# Patient Record
Sex: Male | Born: 1950 | Race: White | Hispanic: No | Marital: Married | Smoking: Never smoker
Health system: Southern US, Community
[De-identification: ages and names within clinical notes are randomized; demographics above are authoritative.]

## PROBLEM LIST (undated history)

## (undated) DIAGNOSIS — N029 Recurrent and persistent hematuria with unspecified morphologic changes: Secondary | ICD-10-CM

## (undated) DIAGNOSIS — N401 Enlarged prostate with lower urinary tract symptoms: Secondary | ICD-10-CM

## (undated) DIAGNOSIS — C801 Malignant (primary) neoplasm, unspecified: Secondary | ICD-10-CM

## (undated) DIAGNOSIS — G4730 Sleep apnea, unspecified: Secondary | ICD-10-CM

## (undated) DIAGNOSIS — E785 Hyperlipidemia, unspecified: Secondary | ICD-10-CM

## (undated) DIAGNOSIS — I10 Essential (primary) hypertension: Secondary | ICD-10-CM

## (undated) DIAGNOSIS — M109 Gout, unspecified: Secondary | ICD-10-CM

## (undated) DIAGNOSIS — T7840XA Allergy, unspecified, initial encounter: Secondary | ICD-10-CM

## (undated) DIAGNOSIS — N138 Other obstructive and reflux uropathy: Secondary | ICD-10-CM

## (undated) HISTORY — DX: Recurrent and persistent hematuria with unspecified morphologic changes: N02.9

## (undated) HISTORY — DX: Allergy, unspecified, initial encounter: T78.40XA

## (undated) HISTORY — DX: Other obstructive and reflux uropathy: N13.8

## (undated) HISTORY — DX: Benign prostatic hyperplasia with lower urinary tract symptoms: N40.1

## (undated) HISTORY — PX: STRABISMUS SURGERY: SHX218

## (undated) HISTORY — DX: Essential (primary) hypertension: I10

## (undated) HISTORY — DX: Gout, unspecified: M10.9

## (undated) HISTORY — DX: Malignant (primary) neoplasm, unspecified: C80.1

## (undated) HISTORY — DX: Hyperlipidemia, unspecified: E78.5

## (undated) HISTORY — PX: FRACTURE SURGERY: SHX138

---

## 1996-09-21 ENCOUNTER — Encounter: Payer: Self-pay | Admitting: Pulmonary Disease

## 1997-06-09 ENCOUNTER — Encounter: Payer: Self-pay | Admitting: Pulmonary Disease

## 1997-06-09 ENCOUNTER — Ambulatory Visit: Admission: RE | Admit: 1997-06-09 | Discharge: 1997-06-09 | Payer: Self-pay | Admitting: Pulmonary Disease

## 1999-06-27 ENCOUNTER — Ambulatory Visit (HOSPITAL_BASED_OUTPATIENT_CLINIC_OR_DEPARTMENT_OTHER): Admission: RE | Admit: 1999-06-27 | Discharge: 1999-06-27 | Payer: Self-pay | Admitting: Pulmonary Disease

## 1999-06-27 ENCOUNTER — Encounter: Payer: Self-pay | Admitting: Pulmonary Disease

## 2005-01-27 ENCOUNTER — Ambulatory Visit: Payer: Self-pay | Admitting: Urology

## 2005-08-25 ENCOUNTER — Ambulatory Visit: Payer: Self-pay | Admitting: Urology

## 2008-02-02 ENCOUNTER — Ambulatory Visit: Payer: Self-pay | Admitting: Pulmonary Disease

## 2008-02-02 DIAGNOSIS — I10 Essential (primary) hypertension: Secondary | ICD-10-CM

## 2008-02-02 DIAGNOSIS — G4733 Obstructive sleep apnea (adult) (pediatric): Secondary | ICD-10-CM

## 2008-03-31 ENCOUNTER — Encounter: Payer: Self-pay | Admitting: Pulmonary Disease

## 2008-06-12 ENCOUNTER — Encounter: Payer: Self-pay | Admitting: Pulmonary Disease

## 2008-07-11 ENCOUNTER — Telehealth (INDEPENDENT_AMBULATORY_CARE_PROVIDER_SITE_OTHER): Payer: Self-pay | Admitting: *Deleted

## 2010-04-22 ENCOUNTER — Encounter: Payer: Self-pay | Admitting: Pulmonary Disease

## 2010-04-24 ENCOUNTER — Ambulatory Visit (INDEPENDENT_AMBULATORY_CARE_PROVIDER_SITE_OTHER): Payer: BC Managed Care – PPO | Admitting: Pulmonary Disease

## 2010-04-24 ENCOUNTER — Encounter: Payer: Self-pay | Admitting: Pulmonary Disease

## 2010-04-24 VITALS — BP 126/78 | HR 57 | Temp 97.9°F | Ht 68.0 in | Wt 223.6 lb

## 2010-04-24 DIAGNOSIS — G4733 Obstructive sleep apnea (adult) (pediatric): Secondary | ICD-10-CM

## 2010-04-24 NOTE — Assessment & Plan Note (Signed)
The pt is doing well with cpap, and feels he is sleeping well with adequate daytime alertness.  His only complaint is mouth dryness, and admits that he is mouth opening during the night.  Will change him over to full face mask, and see if things improve.  I have also encouraged him to work on weight loss.

## 2010-04-24 NOTE — Patient Instructions (Signed)
Will send an order to get you a new full face mask Can increase temperature on your humidifier if you continue having dryness after changing to full face mask Work on weight loss followup with me in 12mos

## 2010-04-24 NOTE — Progress Notes (Signed)
  Subjective:    Patient ID: Eugene Scott, male    DOB: [birth year], 60 y.o.   MRN: [id number]  HPI The pt comes in today for f/u of his osa.  He has been wearing cpap compliantly, and feels it is still helping his sleep and daytime alertness.  His only complaint is that of mouth dryness.  He is wearing a nasal mask, and his wife feels that he has been having mouth opening.  He has not made adjustments to his humidifier.    Review of Systems  Constitutional: Negative for fever and unexpected weight change.  HENT: Negative for ear pain, nosebleeds, congestion, sore throat, rhinorrhea, sneezing, trouble swallowing, dental problem, postnasal drip and sinus pressure.   Eyes: Negative for redness and itching.  Respiratory: Negative for cough, chest tightness, shortness of breath and wheezing.   Cardiovascular: Negative for palpitations and leg swelling.  Gastrointestinal: Negative for nausea and vomiting.  Genitourinary: Negative for dysuria.  Musculoskeletal: Negative for joint swelling.  Skin: Negative for rash.  Neurological: Negative for headaches.  Hematological: Does not bruise/bleed easily.  Psychiatric/Behavioral: Negative for dysphoric mood. The patient is not nervous/anxious.        Objective:   Physical Exam Ow male in nad No skin breakdown or pressure necrosis from cpap mask LE without edema, no cyanosis  Alert, does not appear sleepy, moves all 4        Assessment & Plan:

## 2011-11-11 ENCOUNTER — Encounter: Payer: Self-pay | Admitting: Pulmonary Disease

## 2011-11-11 ENCOUNTER — Ambulatory Visit (INDEPENDENT_AMBULATORY_CARE_PROVIDER_SITE_OTHER): Payer: BC Managed Care – PPO | Admitting: Pulmonary Disease

## 2011-11-11 VITALS — BP 110/82 | HR 67 | Temp 98.2°F | Ht 69.0 in | Wt 224.0 lb

## 2011-11-11 DIAGNOSIS — G4733 Obstructive sleep apnea (adult) (pediatric): Secondary | ICD-10-CM

## 2011-11-11 NOTE — Progress Notes (Signed)
  Subjective:    Patient ID: Eugene Scott, male    DOB: [birth year], 61 y.o.   MRN: [id number]  HPI The patient comes in today for followup of his obstructive sleep apnea.  He is wearing CPAP compliantly, and feels that he is doing well with the device.  He is satisfied with his sleep at night and also his daytime alertness.  He is due for a new mask and supplies.   Review of Systems  Constitutional: Negative for fever and unexpected weight change.  HENT: Negative for ear pain, nosebleeds, congestion, sore throat, rhinorrhea, sneezing, trouble swallowing, dental problem, postnasal drip and sinus pressure.   Eyes: Negative for redness and itching.  Respiratory: Negative for cough, chest tightness, shortness of breath and wheezing.   Cardiovascular: Negative for palpitations and leg swelling.  Gastrointestinal: Negative for nausea and vomiting.  Genitourinary: Negative for dysuria.  Musculoskeletal: Negative for joint swelling.  Skin: Negative for rash.  Neurological: Negative for headaches.  Hematological: Does not bruise/bleed easily.  Psychiatric/Behavioral: Negative for dysphoric mood. The patient is not nervous/anxious.        Objective:   Physical Exam Overweight male in no acute distress Nose without purulence or discharge noted No skin breakdown or pressure necrosis from the CPAP mask Lower extremities without edema, no cyanosis Alert and oriented, moves all 4 extremities, does not appear to be sleepy.       Assessment & Plan:

## 2011-11-11 NOTE — Patient Instructions (Addendum)
Stay on cpap, and keep up with mask changes and supplies Will send an order for a new mask Work on weight loss  followup with me in one year.

## 2011-11-11 NOTE — Assessment & Plan Note (Signed)
The patient is doing very well with CPAP, and feels that his sleep is adequate as well as his daytime alertness.  I have asked him to continue wearing CPAP, and to keep up with mask changes and supplies.  I have also encouraged him to work aggressively on weight loss.

## 2012-02-22 ENCOUNTER — Telehealth: Payer: Self-pay | Admitting: Pulmonary Disease

## 2012-02-22 NOTE — Telephone Encounter (Signed)
Alida have you seen a CMN for this patient?Carron Curie, CMA

## 2012-02-23 NOTE — Telephone Encounter (Signed)
cmn has been faxed.  Spoke with [facility], they have received .Kandice Hams

## 2012-11-11 ENCOUNTER — Ambulatory Visit: Payer: BC Managed Care – PPO | Admitting: Pulmonary Disease

## 2012-12-13 ENCOUNTER — Ambulatory Visit: Payer: BC Managed Care – PPO | Admitting: Pulmonary Disease

## 2013-01-13 ENCOUNTER — Ambulatory Visit: Payer: BC Managed Care – PPO | Admitting: Pulmonary Disease

## 2013-02-08 ENCOUNTER — Encounter: Payer: Self-pay | Admitting: Pulmonary Disease

## 2013-02-08 ENCOUNTER — Encounter (INDEPENDENT_AMBULATORY_CARE_PROVIDER_SITE_OTHER): Payer: Self-pay

## 2013-02-08 ENCOUNTER — Ambulatory Visit (INDEPENDENT_AMBULATORY_CARE_PROVIDER_SITE_OTHER): Payer: BC Managed Care – PPO | Admitting: Pulmonary Disease

## 2013-02-08 VITALS — BP 122/86 | HR 62 | Temp 97.9°F | Ht 68.0 in | Wt 230.0 lb

## 2013-02-08 DIAGNOSIS — G4733 Obstructive sleep apnea (adult) (pediatric): Secondary | ICD-10-CM

## 2013-02-08 NOTE — Progress Notes (Signed)
   Subjective:    Patient ID: Eugene Scott, male    DOB: 1950/02/06, 63 y.o.   MRN: [id number]  HPI The patient comes in today for followup of his obstructive sleep apnea. He is wearing CPAP compliantly, and is having no issues with his pressure or mask fit. He is having some dryness issues, and I've asked him to turn the heat up on his humidifier.  He is satisfied with his sleep at this point, as well as his daytime alertness. Of note, his weight is up 6 pounds since last visit.   Review of Systems  Constitutional: Negative for fever and unexpected weight change.  HENT: Negative for congestion, dental problem, ear pain, nosebleeds, postnasal drip, rhinorrhea, sinus pressure, sneezing, sore throat and trouble swallowing.   Eyes: Negative for redness and itching.  Respiratory: Negative for cough, chest tightness, shortness of breath and wheezing.   Cardiovascular: Negative for palpitations and leg swelling.  Gastrointestinal: Negative for nausea and vomiting.  Genitourinary: Negative for dysuria.  Musculoskeletal: Negative for joint swelling.  Skin: Negative for rash.  Neurological: Negative for headaches.  Hematological: Does not bruise/bleed easily.  Psychiatric/Behavioral: Negative for dysphoric mood. The patient is not nervous/anxious.        Objective:   Physical Exam Overweight male in no acute distress Nose without purulence or discharge noted No skin breakdown or pressure necrosis from the CPAP mask Neck without lymphadenopathy or thyromegaly Lower extremities with minimal edema, no cyanosis Alert and oriented, moves all 4 extremities.       Assessment & Plan:

## 2013-02-08 NOTE — Patient Instructions (Signed)
Continue on cpap, and keep up with supplies and mask changes. Turn heat up on humidifier for more moisture. Work on weight loss followup with me in one year if doing well.

## 2013-02-08 NOTE — Assessment & Plan Note (Signed)
The patient is wearing CPAP compliantly, and feels that he is sleeping well with adequate daytime alertness. I've encouraged him to work aggressively on weight loss, and the patient states that he is getting ready to retire and focus more on his health.  I've also asked him to keep up with his supplies and mask cushion changes. Will see him back in one year if he is doing well.

## 2013-10-18 ENCOUNTER — Telehealth: Payer: Self-pay | Admitting: Pulmonary Disease

## 2013-10-18 DIAGNOSIS — G4733 Obstructive sleep apnea (adult) (pediatric): Secondary | ICD-10-CM

## 2013-10-18 NOTE — Telephone Encounter (Signed)
I called spoke with pt. He reports [facility] is going out of business. He wants to go with [facility]. Order placed. All records will need to be sent with order.  Order placed. Nothing further needed

## 2013-12-05 ENCOUNTER — Ambulatory Visit: Payer: Self-pay | Admitting: Urology

## 2014-02-09 ENCOUNTER — Ambulatory Visit: Payer: Self-pay | Admitting: Gastroenterology

## 2014-02-09 ENCOUNTER — Ambulatory Visit: Payer: BC Managed Care – PPO | Admitting: Pulmonary Disease

## 2014-02-26 ENCOUNTER — Ambulatory Visit (INDEPENDENT_AMBULATORY_CARE_PROVIDER_SITE_OTHER): Payer: BC Managed Care – PPO | Admitting: Pulmonary Disease

## 2014-02-26 ENCOUNTER — Encounter: Payer: Self-pay | Admitting: Pulmonary Disease

## 2014-02-26 ENCOUNTER — Encounter (INDEPENDENT_AMBULATORY_CARE_PROVIDER_SITE_OTHER): Payer: Self-pay

## 2014-02-26 VITALS — BP 118/66 | HR 71 | Temp 97.0°F | Ht 68.0 in | Wt 223.0 lb

## 2014-02-26 DIAGNOSIS — G4733 Obstructive sleep apnea (adult) (pediatric): Secondary | ICD-10-CM

## 2014-02-26 NOTE — Progress Notes (Signed)
   Subjective:    Patient ID: Eugene Scott, male    DOB: 11/10/1950, 64 y.o.   MRN: [id number]  HPI The patient comes in today for follow-up of his obstructive sleep apnea. C Pap compliantly, but is overdue for a new C Pap device. He feels that he has done well overall, and has lost 7 pounds since the last visit. He is also due for a new mask and supplies.   Review of Systems  Constitutional: Negative for fever and unexpected weight change.  HENT: Negative for congestion, dental problem, ear pain, nosebleeds, postnasal drip, rhinorrhea, sinus pressure, sneezing, sore throat and trouble swallowing.   Eyes: Negative for redness and itching.  Respiratory: Negative for cough, chest tightness, shortness of breath and wheezing.   Cardiovascular: Negative for palpitations and leg swelling.  Gastrointestinal: Negative for nausea and vomiting.  Genitourinary: Negative for dysuria.  Musculoskeletal: Negative for joint swelling.  Skin: Negative for rash.  Neurological: Negative for headaches.  Hematological: Does not bruise/bleed easily.  Psychiatric/Behavioral: Negative for dysphoric mood. The patient is not nervous/anxious.        Objective:   Physical Exam Overweight male in no acute distress Nose without purulence or discharge noted No skin breakdown or pressure necrosis from the C Pap mask Neck without lymphadenopathy or thyromegaly Lower extremities without significant edema, no cyanosis Alert and oriented, moves all 4 extremities.       Assessment & Plan:

## 2014-02-26 NOTE — Patient Instructions (Signed)
Will get you a new cpap machine, and keep on the auto setting. Keep working on weight loss followup with me again in one year

## 2014-02-26 NOTE — Assessment & Plan Note (Signed)
The patient has been doing. Well with C Pap, but is currently using a very old device that needs to be replaced. I will send an order to his home care company for this, as well as new supplies. I've encouraged him to continue working on aggressive weight loss, and will see him back in one year.

## 2014-08-28 ENCOUNTER — Ambulatory Visit: Payer: Self-pay | Admitting: Family Medicine

## 2014-09-03 ENCOUNTER — Ambulatory Visit: Payer: Self-pay | Admitting: Family Medicine

## 2014-10-18 ENCOUNTER — Telehealth: Payer: Self-pay

## 2014-10-18 MED ORDER — FINASTERIDE 5 MG PO TABS: 5.0000 mg | ORAL_TABLET | Freq: Every day | ORAL | Status: DC

## 2014-10-18 NOTE — Telephone Encounter (Signed)
[facility] requesting refill for  Finasteride 5mg  tab

## 2014-12-04 ENCOUNTER — Encounter: Payer: Self-pay | Admitting: Family Medicine

## 2014-12-04 ENCOUNTER — Ambulatory Visit (INDEPENDENT_AMBULATORY_CARE_PROVIDER_SITE_OTHER): Payer: BC Managed Care – PPO | Admitting: Family Medicine

## 2014-12-04 VITALS — BP 113/75 | HR 66 | Temp 97.7°F | Ht 67.0 in | Wt 222.0 lb

## 2014-12-04 DIAGNOSIS — Z113 Encounter for screening for infections with a predominantly sexual mode of transmission: Secondary | ICD-10-CM | POA: Diagnosis not present

## 2014-12-04 DIAGNOSIS — Z0000 Encounter for general adult medical examination without abnormal findings: Secondary | ICD-10-CM | POA: Diagnosis not present

## 2014-12-04 LAB — MICROSCOPIC EXAMINATION
Epithelial Cells (non renal): NONE SEEN /hpf (ref 0–10)
WBC, UA: NONE SEEN /hpf (ref 0–?)

## 2014-12-04 LAB — URINALYSIS, ROUTINE W REFLEX MICROSCOPIC
Bilirubin, UA: NEGATIVE
Glucose, UA: NEGATIVE
KETONES UA: NEGATIVE
Leukocytes, UA: NEGATIVE
NITRITE UA: NEGATIVE
Protein, UA: NEGATIVE
Specific Gravity, UA: 1.01 (ref 1.005–1.030)
Urobilinogen, Ur: 0.2 mg/dL (ref 0.2–1.0)
pH, UA: 7 (ref 5.0–7.5)

## 2014-12-04 MED ORDER — SIMVASTATIN 20 MG PO TABS: 20.0000 mg | ORAL_TABLET | Freq: Every day | ORAL | Status: DC

## 2014-12-04 MED ORDER — FINASTERIDE 5 MG PO TABS: 5.0000 mg | ORAL_TABLET | Freq: Every day | ORAL | Status: DC

## 2014-12-04 MED ORDER — ALLOPURINOL 300 MG PO TABS: 300.0000 mg | ORAL_TABLET | Freq: Every day | ORAL | Status: DC

## 2014-12-04 MED ORDER — BENAZEPRIL HCL 5 MG PO TABS: 5.0000 mg | ORAL_TABLET | Freq: Every day | ORAL | Status: DC

## 2014-12-04 MED ORDER — VALACYCLOVIR HCL 1 G PO TABS: 1000.0000 mg | ORAL_TABLET | Freq: Two times a day (BID) | ORAL | Status: DC | PRN

## 2014-12-04 NOTE — Progress Notes (Signed)
   BP 113/75 mmHg  Pulse 66  Temp(Src) 97.7 F (36.5 C)  Ht 5\' 7"  (1.702 m)  Wt 222 lb (100.699 kg)  BMI 34.76 kg/m2  SpO2 98%   Subjective:    Patient ID: Eugene Scott, male    DOB: 25-Oct-1950, 64 y.o.   MRN: [id number]  HPI: Eugene Scott is a 64 y.o. male  Chief Complaint  Patient presents with  . Annual Exam   Patient doing really well just saw urology had prostate check which was normal low PSA Other medications doing well with blood pressure cholesterol and gout  Relevant past medical, surgical, family and social history reviewed and updated as indicated. Interim medical history since our last visit reviewed. Allergies and medications reviewed and updated.  Review of Systems  Constitutional: Negative.   HENT: Negative.   Eyes: Negative.   Respiratory: Negative.   Cardiovascular: Negative.   Gastrointestinal: Negative.   Endocrine: Negative.   Genitourinary: Negative.   Musculoskeletal: Negative.   Skin: Negative.   Allergic/Immunologic: Negative.   Neurological: Negative.   Hematological: Negative.   Psychiatric/Behavioral: Negative.     Per HPI unless specifically indicated above     Objective:    BP 113/75 mmHg  Pulse 66  Temp(Src) 97.7 F (36.5 C)  Ht 5\' 7"  (1.702 m)  Wt 222 lb (100.699 kg)  BMI 34.76 kg/m2  SpO2 98%  Wt Readings from Last 3 Encounters:  12/04/14 222 lb (100.699 kg)  05/07/14 221 lb (100.245 kg)  02/26/14 223 lb (101.152 kg)    Physical Exam  Constitutional: He is oriented to person, place, and time. He appears well-developed and well-nourished.  HENT:  Head: Normocephalic.  Right Ear: External ear normal.  Left Ear: External ear normal.  Nose: Nose normal.  Eyes: Conjunctivae and EOM are normal. Pupils are equal, round, and reactive to light.  Neck: Normal range of motion. Neck supple. No thyromegaly present.  Cardiovascular: Normal rate, regular rhythm, normal heart sounds and intact distal pulses.    Pulmonary/Chest: Effort normal and breath sounds normal.  Abdominal: Soft. Bowel sounds are normal. There is no splenomegaly or hepatomegaly.  Musculoskeletal: Normal range of motion.  Lymphadenopathy:    He has no cervical adenopathy.  Neurological: He is alert and oriented to person, place, and time. He has normal reflexes.  Skin: Skin is warm and dry.  Psychiatric: He has a normal mood and affect. His behavior is normal. Judgment and thought content normal.    No results found for this or any previous visit.    Assessment & Plan:   Problem List Items Addressed This Visit    None    Visit Diagnoses    Routine general medical examination at a health care facility    -  Primary    Relevant Orders    CBC with Differential/Platelet    Comprehensive metabolic panel    Lipid Panel w/o Chol/HDL Ratio    TSH    Urinalysis, Routine w reflex microscopic (not at [facility])    Routine screening for STI (sexually transmitted infection)        Relevant Orders    Hepatitis C Antibody        Follow up plan: Return in about 6 months (around 06/03/2015) for bmp, uric acid lipids, alt, ast.

## 2014-12-05 ENCOUNTER — Telehealth: Payer: Self-pay | Admitting: Family Medicine

## 2014-12-05 ENCOUNTER — Telehealth: Payer: Self-pay

## 2014-12-05 ENCOUNTER — Encounter: Payer: Self-pay | Admitting: Family Medicine

## 2014-12-05 LAB — CBC WITH DIFFERENTIAL/PLATELET
BASOS: 1 %
Basophils Absolute: 0 10*3/uL (ref 0.0–0.2)
EOS (ABSOLUTE): 0.1 10*3/uL (ref 0.0–0.4)
Eos: 2 %
Hematocrit: 44.1 % (ref 37.5–51.0)
Hemoglobin: 15.3 g/dL (ref 12.6–17.7)
IMMATURE GRANULOCYTES: 0 %
Immature Grans (Abs): 0 10*3/uL (ref 0.0–0.1)
Lymphocytes Absolute: 2.8 10*3/uL (ref 0.7–3.1)
Lymphs: 38 %
MCH: 30 pg (ref 26.6–33.0)
MCHC: 34.7 g/dL (ref 31.5–35.7)
MCV: 87 fL (ref 79–97)
Monocytes Absolute: 0.7 10*3/uL (ref 0.1–0.9)
Monocytes: 9 %
NEUTROS PCT: 50 %
Neutrophils Absolute: 3.7 10*3/uL (ref 1.4–7.0)
PLATELETS: 205 10*3/uL (ref 150–379)
RBC: 5.1 x10E6/uL (ref 4.14–5.80)
RDW: 14 % (ref 12.3–15.4)
WBC: 7.4 10*3/uL (ref 3.4–10.8)

## 2014-12-05 LAB — COMPREHENSIVE METABOLIC PANEL
A/G RATIO: 1.8 (ref 1.1–2.5)
ALK PHOS: 75 IU/L (ref 39–117)
ALT: 29 IU/L (ref 0–44)
AST: 23 IU/L (ref 0–40)
Albumin: 4.6 g/dL (ref 3.6–4.8)
BUN/Creatinine Ratio: 16 (ref 10–22)
BUN: 19 mg/dL (ref 8–27)
Bilirubin Total: 0.3 mg/dL (ref 0.0–1.2)
CALCIUM: 10 mg/dL (ref 8.6–10.2)
CHLORIDE: 101 mmol/L (ref 97–106)
CO2: 27 mmol/L (ref 18–29)
Creatinine, Ser: 1.17 mg/dL (ref 0.76–1.27)
GFR calc Af Amer: 76 mL/min/{1.73_m2} (ref >59)
GFR calc non Af Amer: 66 mL/min/{1.73_m2} (ref >59)
Globulin, Total: 2.5 g/dL (ref 1.5–4.5)
Glucose: 97 mg/dL (ref 65–99)
Potassium: 4.5 mmol/L (ref 3.5–5.2)
Sodium: 144 mmol/L (ref 136–144)
Total Protein: 7.1 g/dL (ref 6.0–8.5)

## 2014-12-05 LAB — LIPID PANEL W/O CHOL/HDL RATIO
CHOLESTEROL TOTAL: 171 mg/dL (ref 100–199)
HDL: 40 mg/dL (ref >39)
LDL Calculated: 59 mg/dL (ref 0–99)
Triglycerides: 361 mg/dL — ABNORMAL HIGH (ref 0–149)
VLDL Cholesterol Cal: 72 mg/dL — ABNORMAL HIGH (ref 5–40)

## 2014-12-05 LAB — HEPATITIS C ANTIBODY: Hep C Virus Ab: 0.1 s/co ratio (ref 0.0–0.9)

## 2014-12-05 LAB — TSH: TSH: 2.22 u[IU]/mL (ref 0.450–4.500)

## 2014-12-05 NOTE — Telephone Encounter (Signed)
Verified with pharmacist patient has been taking Benazepril 40mg .  The new Rx sent in was for 5mg  (I believe this was a mistaken entry during abstraction)  Patient has refills from old Rx of 40mg , pharmacy has discarded the new Rx (5mg ) and a new one for the 40's needs to be sent to [facility]

## 2014-12-05 NOTE — Telephone Encounter (Signed)
?   About Benazapril prescription.

## 2014-12-06 MED ORDER — BENAZEPRIL HCL 40 MG PO TABS: 40.0000 mg | ORAL_TABLET | Freq: Every day | ORAL | Status: DC

## 2015-02-27 ENCOUNTER — Ambulatory Visit: Payer: BC Managed Care – PPO | Admitting: Pulmonary Disease

## 2015-02-28 ENCOUNTER — Ambulatory Visit: Payer: BC Managed Care – PPO | Admitting: Pulmonary Disease

## 2015-05-08 ENCOUNTER — Other Ambulatory Visit: Payer: Self-pay | Admitting: Family Medicine

## 2015-05-22 ENCOUNTER — Encounter: Payer: Self-pay | Admitting: Pulmonary Disease

## 2015-05-22 ENCOUNTER — Ambulatory Visit (INDEPENDENT_AMBULATORY_CARE_PROVIDER_SITE_OTHER): Payer: BC Managed Care – PPO | Admitting: Pulmonary Disease

## 2015-05-22 VITALS — BP 118/64 | HR 61 | Ht 68.0 in | Wt 219.2 lb

## 2015-05-22 DIAGNOSIS — G4733 Obstructive sleep apnea (adult) (pediatric): Secondary | ICD-10-CM

## 2015-05-22 DIAGNOSIS — I10 Essential (primary) hypertension: Secondary | ICD-10-CM

## 2015-05-22 NOTE — Progress Notes (Signed)
   Subjective:    Patient ID: Eugene Scott, male    DOB: [birth year], 65 y.o.   MRN: [id number]  HPI  Chief Complaint  Patient presents with  . Follow-up    Former [facility] patient - wears CPAP nightly. Pt is on Auto set, reports good tolerance and sleep. Denies problems wiht mask/pressure. [place]   He has been maintained on CPAP since 2001 and current new machine and 2016 He was put on auto CPAP and he likes the lower pressure in the experience. He denies snoring or daytime somnolence. He just went on a biking cruise to [place] and was able to take his machine there with him and use it. He denies any problems with nasal mask or pressure Download was reviewed and shows average pressure of 13 cm on auto settings with no residuals and no leak  His weight is more or less unchanged   NPSG 2001:  AHI 43/hr autotitration to 11cm 2010  Review of Systems Patient denies significant dyspnea,cough, hemoptysis,  chest pain, palpitations, pedal edema, orthopnea, paroxysmal nocturnal dyspnea, lightheadedness, nausea, vomiting, abdominal or  leg pains      Objective:   Physical Exam  Gen. Pleasant, obese, in no distress ENT - no lesions, no post nasal drip Neck: No JVD, no thyromegaly, no carotid bruits Lungs: no use of accessory muscles, no dullness to percussion, decreased without rales or rhonchi  Cardiovascular: Rhythm regular, heart sounds  normal, no murmurs or gallops, no peripheral edema Musculoskeletal: No deformities, no cyanosis or clubbing , no tremors       Assessment & Plan:

## 2015-05-22 NOTE — Patient Instructions (Signed)
You are on auto CPAP settings with average pressure of 13 cm CPAP is working well

## 2015-05-22 NOTE — Assessment & Plan Note (Signed)
You are on auto CPAP settings with average pressure of 13 cm CPAP is working well  Weight loss encouraged, compliance with goal of at least 4-6 hrs every night is the expectation. Advised against medications with sedative side effects Cautioned against driving when sleepy - understanding that sleepiness will vary on a day to day basis

## 2015-05-22 NOTE — Assessment & Plan Note (Signed)
Controlled.  

## 2015-05-29 ENCOUNTER — Encounter: Payer: Self-pay | Admitting: Pulmonary Disease

## 2015-06-11 ENCOUNTER — Ambulatory Visit (INDEPENDENT_AMBULATORY_CARE_PROVIDER_SITE_OTHER): Payer: BC Managed Care – PPO | Admitting: Family Medicine

## 2015-06-11 ENCOUNTER — Encounter: Payer: Self-pay | Admitting: Family Medicine

## 2015-06-11 VITALS — BP 116/75 | HR 57 | Temp 97.7°F | Ht 68.0 in | Wt 220.0 lb

## 2015-06-11 DIAGNOSIS — N401 Enlarged prostate with lower urinary tract symptoms: Secondary | ICD-10-CM | POA: Diagnosis not present

## 2015-06-11 DIAGNOSIS — M109 Gout, unspecified: Secondary | ICD-10-CM | POA: Diagnosis not present

## 2015-06-11 DIAGNOSIS — E785 Hyperlipidemia, unspecified: Secondary | ICD-10-CM | POA: Diagnosis not present

## 2015-06-11 DIAGNOSIS — I10 Essential (primary) hypertension: Secondary | ICD-10-CM | POA: Diagnosis not present

## 2015-06-11 DIAGNOSIS — N138 Other obstructive and reflux uropathy: Secondary | ICD-10-CM

## 2015-06-11 LAB — LP+ALT+AST PICCOLO, WAIVED
ALT (SGPT) Piccolo, Waived: 41 U/L (ref 10–47)
AST (SGOT) PICCOLO, WAIVED: 34 U/L (ref 11–38)
CHOL/HDL RATIO PICCOLO,WAIVE: 3.4 mg/dL
CHOLESTEROL PICCOLO, WAIVED: 152 mg/dL (ref <200)
HDL Chol Piccolo, Waived: 45 mg/dL — ABNORMAL LOW (ref >59)
LDL Chol Calc Piccolo Waived: 77 mg/dL (ref <100)
Triglycerides Piccolo,Waived: 153 mg/dL — ABNORMAL HIGH (ref <150)
VLDL Chol Calc Piccolo,Waive: 31 mg/dL — ABNORMAL HIGH (ref <30)

## 2015-06-11 MED ORDER — VALACYCLOVIR HCL 1 G PO TABS: 1000.0000 mg | ORAL_TABLET | Freq: Two times a day (BID) | ORAL | Status: DC | PRN

## 2015-06-11 MED ORDER — BENAZEPRIL HCL 40 MG PO TABS: 40.0000 mg | ORAL_TABLET | Freq: Every day | ORAL | Status: DC

## 2015-06-11 NOTE — Assessment & Plan Note (Signed)
The current medical regimen is effective;  continue present plan and medications.  

## 2015-06-11 NOTE — Progress Notes (Signed)
BP 116/75 mmHg  Pulse 57  Temp(Src) 97.7 F (36.5 C)  Ht 5\' 8"  (1.727 m)  Wt 220 lb (99.791 kg)  BMI 33.46 kg/m2  SpO2 96%   Subjective:    Patient ID: Eugene Scott, male    DOB: 06-Jun-1950, 65 y.o.   MRN: [id number]  HPI: Eugene Scott is a 64 y.o. male  Chief Complaint  Patient presents with  . Hypertension  . Hyperlipidemia   Patient follow-up hypertension doing well no complaints from medication takes faithfully without side effects. Cholesterol BPH gout all doing well without side effects from medications and taking faithfully. Valtrex just when necessary use and doing well. Relevant past medical, surgical, family and social history reviewed and updated as indicated. Interim medical history since our last visit reviewed. Allergies and medications reviewed and updated.  Review of Systems  Constitutional: Negative.   Respiratory: Negative.   Cardiovascular: Negative.     Per HPI unless specifically indicated above     Objective:    BP 116/75 mmHg  Pulse 57  Temp(Src) 97.7 F (36.5 C)  Ht 5\' 8"  (1.727 m)  Wt 220 lb (99.791 kg)  BMI 33.46 kg/m2  SpO2 96%  Wt Readings from Last 3 Encounters:  06/11/15 220 lb (99.791 kg)  05/22/15 219 lb 3.2 oz (99.428 kg)  12/04/14 222 lb (100.699 kg)    Physical Exam  Constitutional: He is oriented to person, place, and time. He appears well-developed and well-nourished. No distress.  HENT:  Head: Normocephalic and atraumatic.  Right Ear: Hearing normal.  Left Ear: Hearing normal.  Nose: Nose normal.  Eyes: Conjunctivae and lids are normal. Right eye exhibits no discharge. Left eye exhibits no discharge. No scleral icterus.  Cardiovascular: Normal rate, regular rhythm and normal heart sounds.   Pulmonary/Chest: Effort normal and breath sounds normal. No respiratory distress.  Musculoskeletal: Normal range of motion.  Neurological: He is alert and oriented to person, place, and time.  Skin: Skin is  intact. No rash noted.  Psychiatric: He has a normal mood and affect. His speech is normal and behavior is normal. Judgment and thought content normal. Cognition and memory are normal.    Results for orders placed or performed in visit on 12/04/14  Microscopic Examination  Result Value Ref Range   WBC, UA None seen 0 -  5 /hpf   RBC, UA 3-10 (A) 0 -  2 /hpf   Epithelial Cells (non renal) None seen 0 - 10 /hpf   Bacteria, UA Few None seen/Few  CBC with Differential/Platelet  Result Value Ref Range   WBC 7.4 3.4 - 10.8 x10E3/uL   RBC 5.10 4.14 - 5.80 x10E6/uL   Hemoglobin 15.3 12.6 - 17.7 g/dL   Hematocrit 44.1 37.5 - 51.0 %   MCV 87 79 - 97 fL   MCH 30.0 26.6 - 33.0 pg   MCHC 34.7 31.5 - 35.7 g/dL   RDW 14.0 12.3 - 15.4 %   Platelets 205 150 - 379 x10E3/uL   Neutrophils 50 %   Lymphs 38 %   Monocytes 9 %   Eos 2 %   Basos 1 %   Neutrophils Absolute 3.7 1.4 - 7.0 x10E3/uL   Lymphocytes Absolute 2.8 0.7 - 3.1 x10E3/uL   Monocytes Absolute 0.7 0.1 - 0.9 x10E3/uL   EOS (ABSOLUTE) 0.1 0.0 - 0.4 x10E3/uL   Basophils Absolute 0.0 0.0 - 0.2 x10E3/uL   Immature Granulocytes 0 %   Immature Grans (Abs) 0.0 0.0 -  0.1 x10E3/uL  Comprehensive metabolic panel  Result Value Ref Range   Glucose 97 65 - 99 mg/dL   BUN 19 8 - 27 mg/dL   Creatinine, Ser 1.17 0.76 - 1.27 mg/dL   GFR calc non Af Amer 66 >59 mL/min/1.73   GFR calc Af Amer 76 >59 mL/min/1.73   BUN/Creatinine Ratio 16 10 - 22   Sodium 144 136 - 144 mmol/L   Potassium 4.5 3.5 - 5.2 mmol/L   Chloride 101 97 - 106 mmol/L   CO2 27 18 - 29 mmol/L   Calcium 10.0 8.6 - 10.2 mg/dL   Total Protein 7.1 6.0 - 8.5 g/dL   Albumin 4.6 3.6 - 4.8 g/dL   Globulin, Total 2.5 1.5 - 4.5 g/dL   Albumin/Globulin Ratio 1.8 1.1 - 2.5   Bilirubin Total 0.3 0.0 - 1.2 mg/dL   Alkaline Phosphatase 75 39 - 117 IU/L   AST 23 0 - 40 IU/L   ALT 29 0 - 44 IU/L  Lipid Panel w/o Chol/HDL Ratio  Result Value Ref Range   Cholesterol, Total 171 100 - 199  mg/dL   Triglycerides 361 (H) 0 - 149 mg/dL   HDL 40 >39 mg/dL   VLDL Cholesterol Cal 72 (H) 5 - 40 mg/dL   LDL Calculated 59 0 - 99 mg/dL  TSH  Result Value Ref Range   TSH 2.220 0.450 - 4.500 uIU/mL  Urinalysis, Routine w reflex microscopic (not at [facility])  Result Value Ref Range   Specific Gravity, UA 1.010 1.005 - 1.030   pH, UA 7.0 5.0 - 7.5   Color, UA Yellow Yellow   Appearance Ur Clear Clear   Leukocytes, UA Negative Negative   Protein, UA Negative Negative/Trace   Glucose, UA Negative Negative   Ketones, UA Negative Negative   RBC, UA 3+ (A) Negative   Bilirubin, UA Negative Negative   Urobilinogen, Ur 0.2 0.2 - 1.0 mg/dL   Nitrite, UA Negative Negative   Microscopic Examination See below:   Hepatitis C Antibody  Result Value Ref Range   Hep C Virus Ab 0.1 0.0 - 0.9 s/co ratio      Assessment & Plan:   Problem List Items Addressed This Visit      Cardiovascular and Mediastinum   Essential hypertension - Primary    The current medical regimen is effective;  continue present plan and medications.       Relevant Medications   benazepril (LOTENSIN) 40 MG tablet   Other Relevant Orders   Basic metabolic panel   LP+ALT+AST Piccolo, Waived   Uric acid     Genitourinary   BPH (benign prostatic hypertrophy) with urinary obstruction    The current medical regimen is effective;  continue present plan and medications.         Other   Gout   Hyperlipidemia    The current medical regimen is effective;  continue present plan and medications.       Relevant Medications   benazepril (LOTENSIN) 40 MG tablet       Follow up plan: Return in about 6 months (around 12/11/2015), or if symptoms worsen or fail to improve, for Physical Exam.

## 2015-06-12 ENCOUNTER — Encounter: Payer: Self-pay | Admitting: Family Medicine

## 2015-06-12 LAB — BASIC METABOLIC PANEL
BUN/Creatinine Ratio: 19 (ref 10–24)
BUN: 22 mg/dL (ref 8–27)
CALCIUM: 9.6 mg/dL (ref 8.6–10.2)
CO2: 22 mmol/L (ref 18–29)
CREATININE: 1.13 mg/dL (ref 0.76–1.27)
Chloride: 103 mmol/L (ref 96–106)
GFR calc Af Amer: 79 mL/min/{1.73_m2} (ref >59)
GFR, EST NON AFRICAN AMERICAN: 68 mL/min/{1.73_m2} (ref >59)
GLUCOSE: 105 mg/dL — AB (ref 65–99)
Potassium: 4.7 mmol/L (ref 3.5–5.2)
SODIUM: 140 mmol/L (ref 134–144)

## 2015-06-12 LAB — URIC ACID: Uric Acid: 4.7 mg/dL (ref 3.7–8.6)

## 2015-08-05 ENCOUNTER — Telehealth: Payer: Self-pay | Admitting: Pulmonary Disease

## 2015-08-05 DIAGNOSIS — G4733 Obstructive sleep apnea (adult) (pediatric): Secondary | ICD-10-CM

## 2015-08-05 NOTE — Telephone Encounter (Signed)
Spoke with pt. He needs an order sent to [facility] for new CPAP supplies. Order has been sent in. Nothing further was needed.

## 2015-12-02 ENCOUNTER — Other Ambulatory Visit: Payer: Self-pay | Admitting: Family Medicine

## 2015-12-05 ENCOUNTER — Encounter: Payer: BC Managed Care – PPO | Admitting: Family Medicine

## 2015-12-23 ENCOUNTER — Ambulatory Visit (INDEPENDENT_AMBULATORY_CARE_PROVIDER_SITE_OTHER): Payer: BC Managed Care – PPO | Admitting: Family Medicine

## 2015-12-23 ENCOUNTER — Encounter: Payer: Self-pay | Admitting: Family Medicine

## 2015-12-23 DIAGNOSIS — I10 Essential (primary) hypertension: Secondary | ICD-10-CM

## 2015-12-23 DIAGNOSIS — H109 Unspecified conjunctivitis: Secondary | ICD-10-CM | POA: Diagnosis not present

## 2015-12-23 MED ORDER — POLYMYXIN B-TRIMETHOPRIM 10000-0.1 UNIT/ML-% OP SOLN: 1.0000 [drp] | OPHTHALMIC | 0 refills | Status: DC

## 2015-12-23 NOTE — Assessment & Plan Note (Signed)
The current medical regimen is effective;  continue present plan and medications.  

## 2015-12-23 NOTE — Progress Notes (Signed)
BP 130/86 (BP Location: Left Arm, Patient Position: Sitting, Cuff Size: Large)   Pulse 63   Temp 98.5 F (36.9 C)   Wt 221 lb (100.2 kg)   SpO2 99%   BMI 33.60 kg/m    Subjective:    Patient ID: Eugene Scott, male    DOB: 1950/09/16, 65 y.o.   MRN: [id number]  HPI: Eugene Scott is a 65 y.o. male  Chief Complaint  Patient presents with  . Eye Pain    Right X 2 days, redness, discharge, swelling and soreness   Patient with purulent drainage from right eye ordinarily wears contacts but has not been wearing contacts been doing hot compresses cleared up a little bit but still feels uncomfortable and with drainage. Vision is been okay. Using glasses. Blood pressures remained okay with no issues with medications. No gout complaints and no complaints from cholesterol medications. Relevant past medical, surgical, family and social history reviewed and updated as indicated. Interim medical history since our last visit reviewed. Allergies and medications reviewed and updated.  Review of Systems  Constitutional: Negative.   Eyes: Positive for discharge and redness. Negative for photophobia, pain and visual disturbance.  Respiratory: Negative.   Cardiovascular: Negative.     Per HPI unless specifically indicated above     Objective:    BP 130/86 (BP Location: Left Arm, Patient Position: Sitting, Cuff Size: Large)   Pulse 63   Temp 98.5 F (36.9 C)   Wt 221 lb (100.2 kg)   SpO2 99%   BMI 33.60 kg/m   Wt Readings from Last 3 Encounters:  12/23/15 221 lb (100.2 kg)  06/11/15 220 lb (99.8 kg)  05/22/15 219 lb 3.2 oz (99.4 kg)    Physical Exam  Constitutional: He is oriented to person, place, and time. He appears well-developed and well-nourished. No distress.  HENT:  Head: Normocephalic and atraumatic.  Right Ear: Hearing normal.  Left Ear: Hearing normal.  Nose: Nose normal.  Eyes: Lids are normal. Right eye exhibits discharge. Left eye exhibits no  discharge. No scleral icterus.  Right eye with some mattering pupils distorted from previous surgery conjunctiva inflamed.  Cardiovascular: Normal rate, regular rhythm and normal heart sounds.   Pulmonary/Chest: Effort normal. No respiratory distress.  Musculoskeletal: Normal range of motion.  Neurological: He is alert and oriented to person, place, and time.  Skin: Skin is intact. No rash noted.  Psychiatric: He has a normal mood and affect. His speech is normal and behavior is normal. Judgment and thought content normal. Cognition and memory are normal.    Results for orders placed or performed in visit on [id number]  Basic metabolic panel  Result Value Ref Range   Glucose 105 (H) 65 - 99 mg/dL   BUN 22 8 - 27 mg/dL   Creatinine, Ser 1.13 0.76 - 1.27 mg/dL   GFR calc non Af Amer 68 >59 mL/min/1.73   GFR calc Af Amer 79 >59 mL/min/1.73   BUN/Creatinine Ratio 19 10 - 24   Sodium 140 134 - 144 mmol/L   Potassium 4.7 3.5 - 5.2 mmol/L   Chloride 103 96 - 106 mmol/L   CO2 22 18 - 29 mmol/L   Calcium 9.6 8.6 - 10.2 mg/dL  LP+ALT+AST Piccolo, Waived  Result Value Ref Range   ALT (SGPT) Piccolo, Waived 41 10 - 47 U/L   AST (SGOT) Piccolo, Waived 34 11 - 38 U/L   Cholesterol Piccolo, Waived 152 <200 mg/dL   HDL Chol Piccolo,  Waived 45 (L) >59 mg/dL   Triglycerides Piccolo,Waived 153 (H) <150 mg/dL   Chol/HDL Ratio Piccolo,Waive 3.4 mg/dL   LDL Chol Calc Piccolo Waived 77 <100 mg/dL   VLDL Chol Calc Piccolo,Waive 31 (H) <30 mg/dL  Uric acid  Result Value Ref Range   Uric Acid 4.7 3.7 - 8.6 mg/dL      Assessment & Plan:   Problem List Items Addressed This Visit      Cardiovascular and Mediastinum   Essential hypertension    The current medical regimen is effective;  continue present plan and medications.         Other   Conjunctivitis      Discussed conjunctivitis care and treatment use Polytrim prevention of contagious and spread. Use of medications  Follow up  plan: Return for As scheduled.

## 2015-12-27 ENCOUNTER — Ambulatory Visit (INDEPENDENT_AMBULATORY_CARE_PROVIDER_SITE_OTHER): Payer: BC Managed Care – PPO | Admitting: Family Medicine

## 2015-12-27 ENCOUNTER — Encounter: Payer: Self-pay | Admitting: Family Medicine

## 2015-12-27 VITALS — BP 128/82 | HR 71 | Temp 97.9°F | Wt 222.0 lb

## 2015-12-27 DIAGNOSIS — J069 Acute upper respiratory infection, unspecified: Secondary | ICD-10-CM | POA: Diagnosis not present

## 2015-12-27 DIAGNOSIS — B9789 Other viral agents as the cause of diseases classified elsewhere: Secondary | ICD-10-CM | POA: Diagnosis not present

## 2015-12-27 MED ORDER — AZITHROMYCIN 250 MG PO TABS: ORAL_TABLET | ORAL | 0 refills | Status: DC

## 2015-12-27 MED ORDER — HYDROCOD POLST-CPM POLST ER 10-8 MG/5ML PO SUER: 5.0000 mL | Freq: Two times a day (BID) | ORAL | 0 refills | Status: DC | PRN

## 2015-12-27 MED ORDER — LIDOCAINE VISCOUS 2 % MT SOLN: 5.0000 mL | OROMUCOSAL | 0 refills | Status: DC | PRN

## 2015-12-27 MED ORDER — BENZONATATE 100 MG PO CAPS: 200.0000 mg | ORAL_CAPSULE | Freq: Three times a day (TID) | ORAL | 0 refills | Status: DC | PRN

## 2015-12-27 NOTE — Patient Instructions (Signed)
Follow up as needed

## 2015-12-27 NOTE — Progress Notes (Signed)
   BP 128/82 (BP Location: Right Arm, Patient Position: Sitting, Cuff Size: Large)   Pulse 71   Temp 97.9 F (36.6 C)   Wt 222 lb (100.7 kg)   SpO2 100%   BMI 33.75 kg/m    Subjective:    Patient ID: Eugene Scott, male    DOB: 05-04-1950, 65 y.o.   MRN: [id number]  HPI: Aeddon Merryfield is a 65 y.o. male  Chief Complaint  Patient presents with  . URI    X 4 days, patient states that he started having a scratchy throat and nasal congestion on Monday after he left the here.    Patient presents with 5 day history of sore throat, congestion, and cough. Denies fever, chills, facial pain. Has been taking ibuprofen and cough suppressants with some relief. No known sick contacts.   Relevant past medical, surgical, family and social history reviewed and updated as indicated. Interim medical history since our last visit reviewed. Allergies and medications reviewed and updated.  Review of Systems  Constitutional: Negative.   HENT: Positive for congestion, sinus pressure and sore throat.   Eyes: Negative.   Respiratory: Positive for cough.   Cardiovascular: Negative.   Gastrointestinal: Negative.   Genitourinary: Negative.   Musculoskeletal: Negative.   Neurological: Negative.   Psychiatric/Behavioral: Negative.     Per HPI unless specifically indicated above     Objective:    BP 128/82 (BP Location: Right Arm, Patient Position: Sitting, Cuff Size: Large)   Pulse 71   Temp 97.9 F (36.6 C)   Wt 222 lb (100.7 kg)   SpO2 100%   BMI 33.75 kg/m   Wt Readings from Last 3 Encounters:  12/27/15 222 lb (100.7 kg)  12/23/15 221 lb (100.2 kg)  06/11/15 220 lb (99.8 kg)    Physical Exam  Constitutional: He is oriented to person, place, and time. He appears well-developed and well-nourished.  HENT:  Head: Atraumatic.  Right Ear: External ear normal.  Left Ear: External ear normal.  Oropharynx erythematous, no exudates or tonsillar edema  Eyes: Conjunctivae are  normal. Pupils are equal, round, and reactive to light.  Neck: Normal range of motion. Neck supple.  Cardiovascular: Normal rate and normal heart sounds.   Pulmonary/Chest: Effort normal and breath sounds normal.  Musculoskeletal: Normal range of motion.  Neurological: He is alert and oriented to person, place, and time.  Skin: Skin is warm and dry.  Psychiatric: He has a normal mood and affect. His behavior is normal.  Nursing note and vitals reviewed.     Assessment & Plan:   Problem List Items Addressed This Visit    None    Visit Diagnoses    Viral upper respiratory tract infection    -  Primary   No evidence of bacterial infection, treat with supportive care, tessalon, tussionex, mucinex, viscous lidocaine. Z-pak at pharmacy in case worse over weekend   Relevant Medications   azithromycin (ZITHROMAX) 250 MG tablet       Follow up plan: Return if symptoms worsen or fail to improve.

## 2016-01-09 ENCOUNTER — Encounter: Payer: BC Managed Care – PPO | Admitting: Family Medicine

## 2016-01-16 ENCOUNTER — Encounter: Payer: BC Managed Care – PPO | Admitting: Family Medicine

## 2016-01-16 ENCOUNTER — Other Ambulatory Visit: Payer: Self-pay | Admitting: Family Medicine

## 2016-02-29 ENCOUNTER — Other Ambulatory Visit: Payer: Self-pay | Admitting: Family Medicine

## 2016-03-15 IMAGING — CT CT ABDOMEN AND PELVIS WITHOUT AND WITH CONTRAST
2 of 10 series · 10 of 46 positions shown, 16 images · IV contrast (isovue)
Comparison: 08/25/2005

CLINICAL DATA: Microscopic hematuria found on recent physical
examination 1 month ago.

EXAM:
CT ABDOMEN AND PELVIS WITHOUT AND WITH CONTRAST
TECHNIQUE: Multidetector CT imaging of the abdomen and pelvis was performed
following the standard protocol before and following the bolus
administration of intravenous contrast.
CONTRAST:  125 cc Isovue 370

[Series 7: cor hematuria > 45 wo · coronal · 0.86mm/px · 2 of 184 slices shown, 3 images]
[im 62/184  soft-tissue]
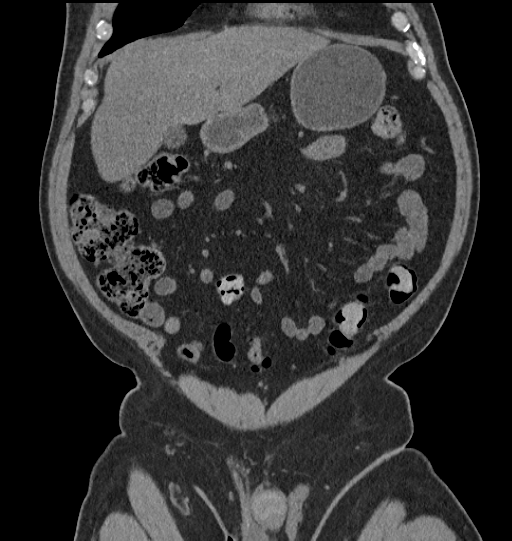
[im 62/184  bone]
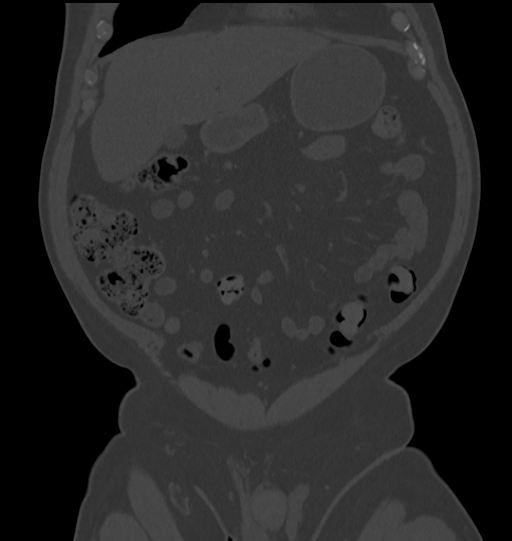
[im 123/184  soft-tissue]
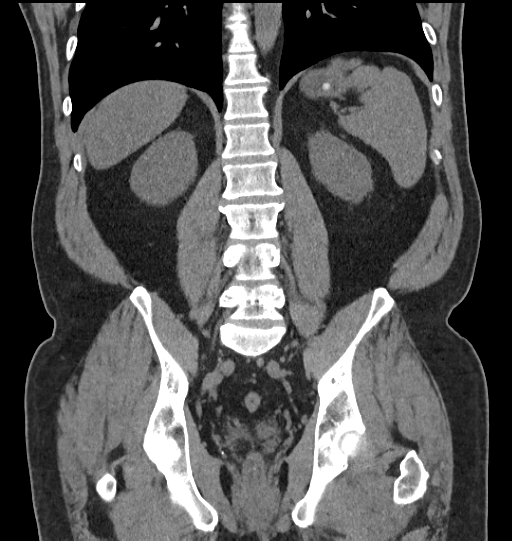

[Series 12: delay · axial · delayed · 0.82mm/px · z∈[-1014,-630]mm · 8 of 99 slices shown, 13 images]
[im 11/99  soft-tissue]
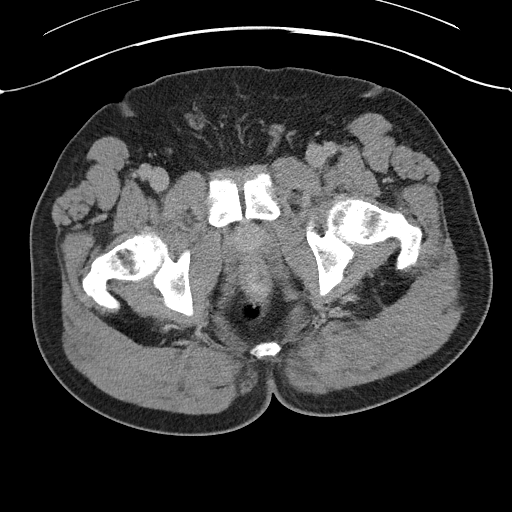
[im 11/99  bone]
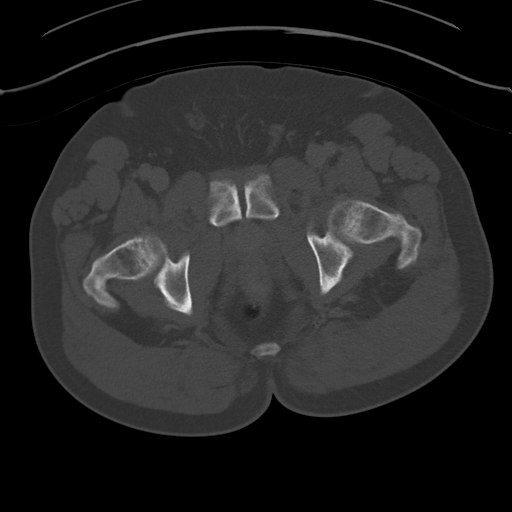
[im 22/99  soft-tissue]
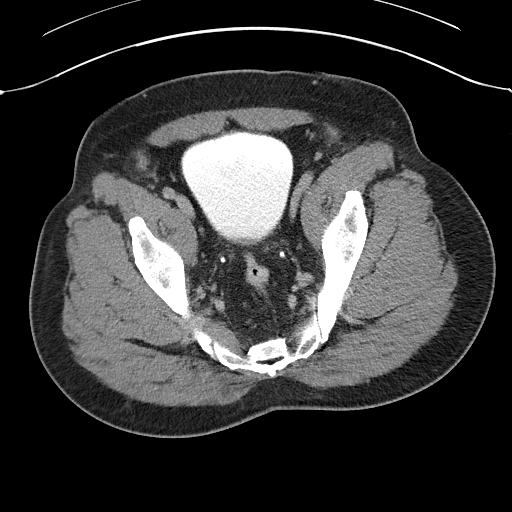
[im 33/99  soft-tissue]
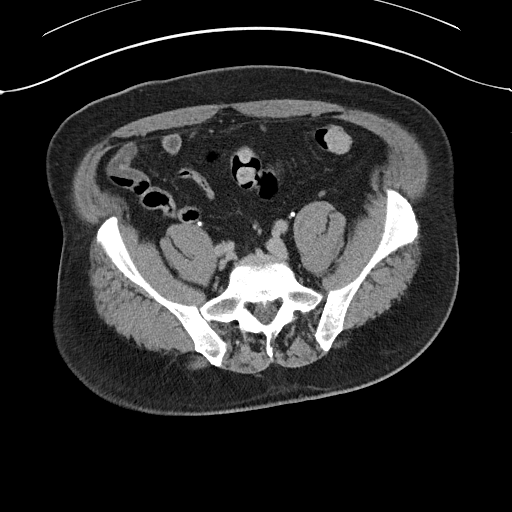
[im 44/99  soft-tissue]
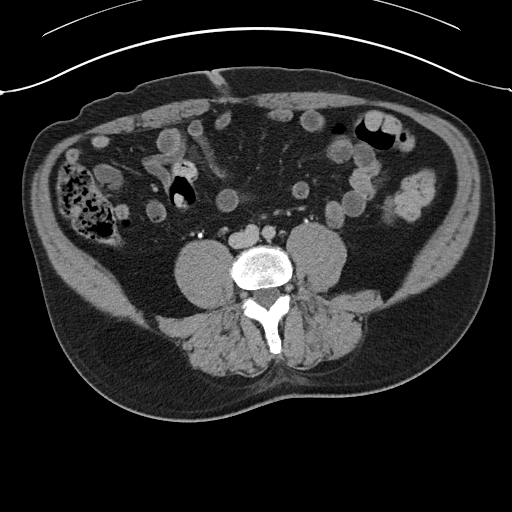
[im 55/99  soft-tissue]
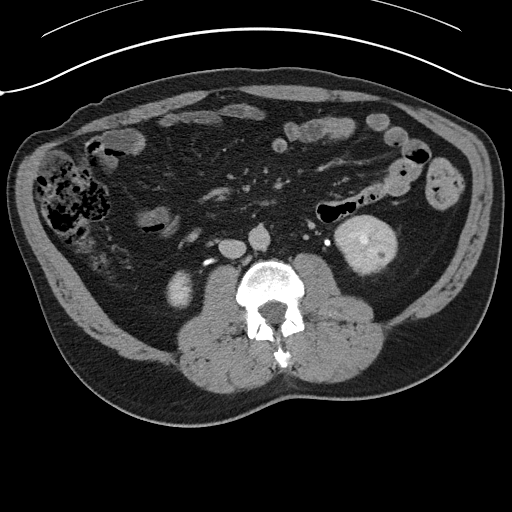
[im 55/99  lung]
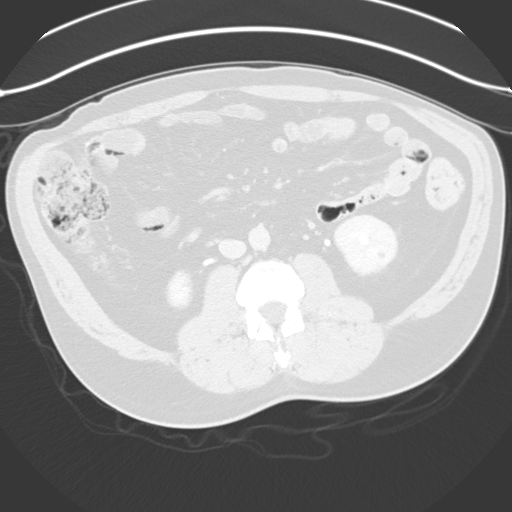
[im 66/99  soft-tissue]
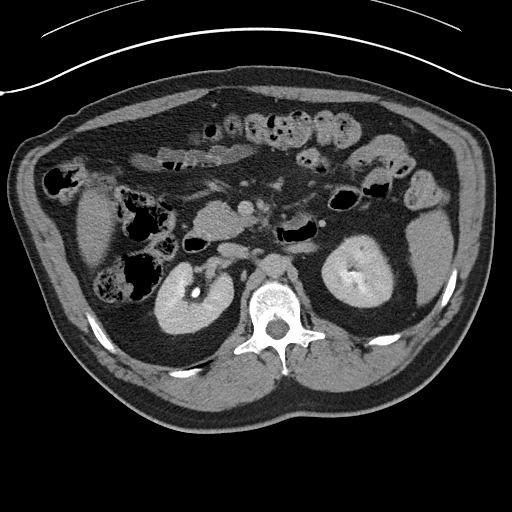
[im 66/99  lung]
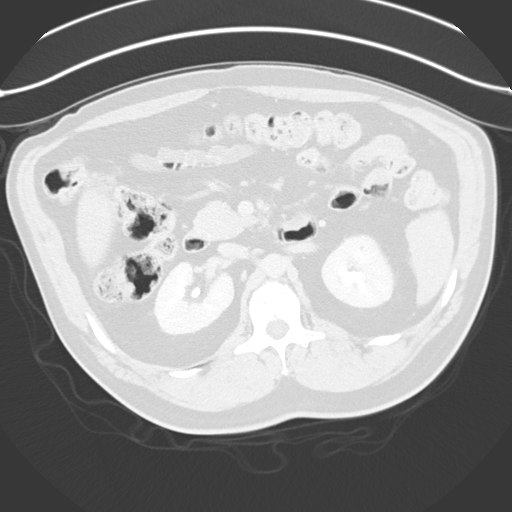
[im 77/99  soft-tissue]
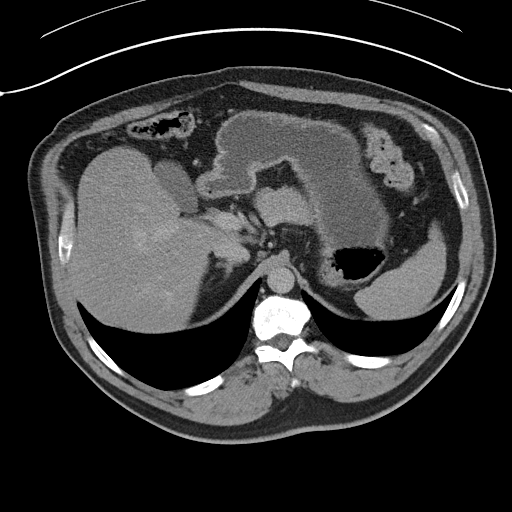
[im 77/99  lung]
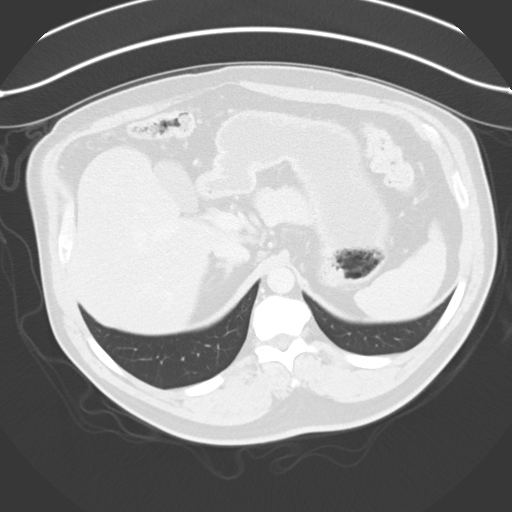
[im 88/99  soft-tissue]
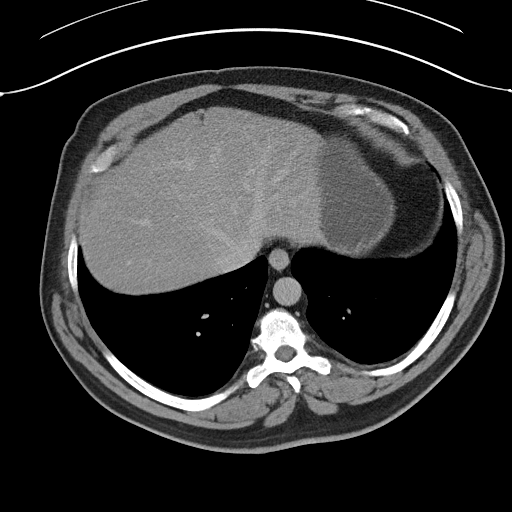
[im 88/99  lung]
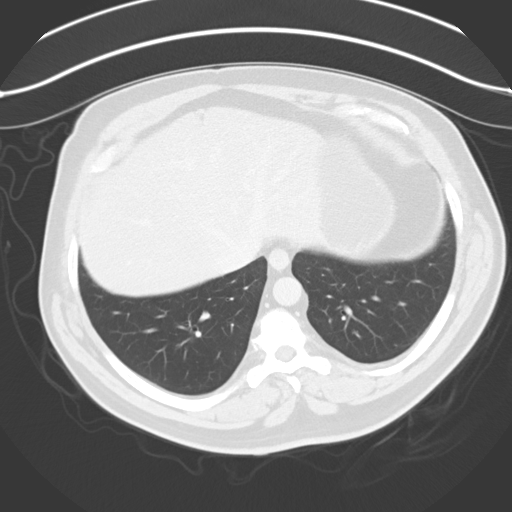

[10 of 46 positions shown; findings below may reference images not displayed]

FINDINGS: Lower chest: The lung bases are clear of acute process. No pleural
effusion or pulmonary lesions. The heart is normal in size. No
pericardial effusion. The distal esophagus and aorta are
unremarkable.

Hepatobiliary: No focal hepatic lesions or intrahepatic biliary
dilatation. The gallbladder is normal. No common bile duct
dilatation.

Pancreas: Normal.

Spleen: Normal.

Adrenals/Urinary Tract: The adrenal glands are normal. No renal,
ureteral or bladder calculi. Both kidneys demonstrate normal
enhancement/ perfusion. There are small bilateral simple appearing
renal cysts. No worrisome renal lesions. The delayed images did not
demonstrate any collecting system abnormalities. Both ureters are
normal. The bladder is normal. Minimal median lobe hypertrophy of
the prostate gland impressing on the base of the bladder.

Stomach/Bowel: The stomach, duodenum, small bowel and colon are
unremarkable. No inflammatory changes, mass lesions or obstructive
findings. The terminal ileum is normal. The appendix is normal.

Vascular/Lymphatic: The aorta is normal in caliber. The branch
vessels are patent. No mesenteric or retroperitoneal mass or
adenopathy.

Pelvis: The bladder, prostate gland and seminal vesicles are
unremarkable. No pelvic mass, adenopathy or free pelvic fluid
collections. Small scattered lymph nodes are noted. No inguinal mass
or hernia. No adenopathy.

Musculoskeletal: No significant bony findings.
IMPRESSION: 1. No CT findings to account for the patient's microhematuria. No
renal, ureteral or bladder calculi are mass. Simple appearing
bilateral renal cysts.
2. Mild median lobe hypertrophy of the prostate gland.
3. No acute abdominal/pelvic findings, mass lesions or
lymphadenopathy.

## 2016-04-27 ENCOUNTER — Other Ambulatory Visit: Payer: Self-pay

## 2016-04-27 MED ORDER — ALLOPURINOL 300 MG PO TABS: 300.0000 mg | ORAL_TABLET | Freq: Every day | ORAL | 3 refills | Status: DC

## 2016-04-27 NOTE — Telephone Encounter (Signed)
PT REQUESTING 90 DAY SUPPLY

## 2016-05-05 ENCOUNTER — Encounter: Payer: Self-pay | Admitting: Family Medicine

## 2016-05-05 ENCOUNTER — Ambulatory Visit (INDEPENDENT_AMBULATORY_CARE_PROVIDER_SITE_OTHER): Payer: Medicare Other | Admitting: Family Medicine

## 2016-05-05 VITALS — BP 126/84 | HR 92 | Ht 68.0 in | Wt 224.0 lb

## 2016-05-05 DIAGNOSIS — Z1329 Encounter for screening for other suspected endocrine disorder: Secondary | ICD-10-CM

## 2016-05-05 DIAGNOSIS — Z13220 Encounter for screening for lipoid disorders: Secondary | ICD-10-CM

## 2016-05-05 DIAGNOSIS — I10 Essential (primary) hypertension: Secondary | ICD-10-CM | POA: Diagnosis not present

## 2016-05-05 DIAGNOSIS — N138 Other obstructive and reflux uropathy: Secondary | ICD-10-CM

## 2016-05-05 DIAGNOSIS — N401 Enlarged prostate with lower urinary tract symptoms: Secondary | ICD-10-CM | POA: Diagnosis not present

## 2016-05-05 DIAGNOSIS — Z125 Encounter for screening for malignant neoplasm of prostate: Secondary | ICD-10-CM

## 2016-05-05 DIAGNOSIS — E1169 Type 2 diabetes mellitus with other specified complication: Secondary | ICD-10-CM

## 2016-05-05 DIAGNOSIS — Z7189 Other specified counseling: Secondary | ICD-10-CM

## 2016-05-05 DIAGNOSIS — M109 Gout, unspecified: Secondary | ICD-10-CM

## 2016-05-05 DIAGNOSIS — Z23 Encounter for immunization: Secondary | ICD-10-CM

## 2016-05-05 DIAGNOSIS — G4733 Obstructive sleep apnea (adult) (pediatric): Secondary | ICD-10-CM | POA: Diagnosis not present

## 2016-05-05 DIAGNOSIS — Z0000 Encounter for general adult medical examination without abnormal findings: Secondary | ICD-10-CM

## 2016-05-05 DIAGNOSIS — E785 Hyperlipidemia, unspecified: Secondary | ICD-10-CM

## 2016-05-05 LAB — MICROSCOPIC EXAMINATION: Bacteria, UA: NONE SEEN

## 2016-05-05 LAB — URINALYSIS, ROUTINE W REFLEX MICROSCOPIC
Bilirubin, UA: NEGATIVE
Glucose, UA: NEGATIVE
Ketones, UA: NEGATIVE
Leukocytes, UA: NEGATIVE
Nitrite, UA: NEGATIVE
PH UA: 5.5 (ref 5.0–7.5)
PROTEIN UA: NEGATIVE
Specific Gravity, UA: 1.01 (ref 1.005–1.030)
UUROB: 0.2 mg/dL (ref 0.2–1.0)

## 2016-05-05 MED ORDER — ALLOPURINOL 300 MG PO TABS: 300.0000 mg | ORAL_TABLET | Freq: Every day | ORAL | 4 refills | Status: DC

## 2016-05-05 MED ORDER — BENAZEPRIL HCL 40 MG PO TABS: 40.0000 mg | ORAL_TABLET | Freq: Every day | ORAL | 4 refills | Status: DC

## 2016-05-05 MED ORDER — SIMVASTATIN 40 MG PO TABS: 40.0000 mg | ORAL_TABLET | Freq: Every day | ORAL | 4 refills | Status: DC

## 2016-05-05 MED ORDER — FINASTERIDE 5 MG PO TABS: 5.0000 mg | ORAL_TABLET | Freq: Every day | ORAL | 4 refills | Status: DC

## 2016-05-05 MED ORDER — VALACYCLOVIR HCL 1 G PO TABS: 1000.0000 mg | ORAL_TABLET | Freq: Two times a day (BID) | ORAL | 6 refills | Status: DC | PRN

## 2016-05-05 NOTE — Addendum Note (Signed)
Addended by: Gerda Diss A on: 05/05/2016 01:56 PM   Modules accepted: Orders

## 2016-05-05 NOTE — Assessment & Plan Note (Signed)
A voluntary discussion about advance care planning including the explanation and discussion of advance directives was extensively discussed  with the patient.  Explanation about the health care proxy and Living will was reviewed and packet with forms with explanation of how to fill them out was given.  Patient has already completed all these documents

## 2016-05-05 NOTE — Progress Notes (Signed)
BP 126/84   Pulse 92   Ht _0  (1.727 m)   Wt 224 lb (101.6 kg)   SpO2 99%   BMI 34.06 kg/m    Subjective:    Patient ID: Eugene Scott, male    DOB: 27-Feb-1950, 66 y.o.   MRN: [id number]  HPI: Eugene Scott is a 66 y.o. male  Annual exam AWV metrics met Patient all in all doing well no gout symptoms taking allopurinol without problems. Blood pressure good control with Benzapril no issues. Simvastatin working for cholesterol without complaints or issues. BPH stable was just at urology several months ago with normal report and continue  finasteride Also CPAP doing well  Relevant past medical, surgical, family and social history reviewed and updated as indicated. Interim medical history since our last visit reviewed. Allergies and medications reviewed and updated.  Review of Systems  Constitutional: Negative.   HENT: Negative.   Eyes: Negative.   Respiratory: Negative.   Cardiovascular: Negative.   Gastrointestinal: Negative.   Endocrine: Negative.   Genitourinary: Negative.   Musculoskeletal: Negative.   Skin: Negative.   Allergic/Immunologic: Negative.   Neurological: Negative.   Hematological: Negative.   Psychiatric/Behavioral: Negative.     Per HPI unless specifically indicated above     Objective:    BP 126/84   Pulse 92   Ht _1  (1.727 m)   Wt 224 lb (101.6 kg)   SpO2 99%   BMI 34.06 kg/m   Wt Readings from Last 3 Encounters:  05/05/16 224 lb (101.6 kg)  12/27/15 222 lb (100.7 kg)  12/23/15 221 lb (100.2 kg)    Physical Exam  Constitutional: He is oriented to person, place, and time. He appears well-developed and well-nourished.  HENT:  Head: Normocephalic.  Right Ear: External ear normal.  Left Ear: External ear normal.  Nose: Nose normal.  Eyes: Conjunctivae and EOM are normal. Pupils are equal, round, and reactive to light.  Neck: Normal Scott of motion. Neck supple. No thyromegaly present.  Cardiovascular: Normal rate,  regular rhythm, normal heart sounds and intact distal pulses.   Pulmonary/Chest: Effort normal and breath sounds normal.  Abdominal: Soft. Bowel sounds are normal. There is no splenomegaly or hepatomegaly.  Genitourinary: Penis normal.  Genitourinary Comments: Exam done at urology  Musculoskeletal: Normal Scott of motion.  Lymphadenopathy:    He has no cervical adenopathy.  Neurological: He is alert and oriented to person, place, and time. He has normal reflexes.  Skin: Skin is warm and dry.  Psychiatric: He has a normal mood and affect. His behavior is normal. Judgment and thought content normal.    Results for orders placed or performed in visit on 14/43/15  Basic metabolic panel  Result Value Ref Scott   Glucose 105 (H) 65 - 99 mg/dL   BUN 22 8 - 27 mg/dL   Creatinine, Ser 1.13 0.76 - 1.27 mg/dL   GFR calc non Af Amer 68 >59 mL/min/1.73   GFR calc Af Amer 79 >59 mL/min/1.73   BUN/Creatinine Ratio 19 10 - 24   Sodium 140 134 - 144 mmol/L   Potassium 4.7 3.5 - 5.2 mmol/L   Chloride 103 96 - 106 mmol/L   CO2 22 18 - 29 mmol/L   Calcium 9.6 8.6 - 10.2 mg/dL  LP+ALT+AST Piccolo, Waived  Result Value Ref Scott   ALT (SGPT) Piccolo, Waived 41 10 - 47 U/L   AST (SGOT) Piccolo, Waived 34 11 - 38 U/L   Cholesterol Piccolo,  Waived 152 <200 mg/dL   HDL Chol Piccolo, Waived 45 (L) >59 mg/dL   Triglycerides Piccolo,Waived 153 (H) <150 mg/dL   Chol/HDL Ratio Piccolo,Waive 3.4 mg/dL   LDL Chol Calc Piccolo Waived 77 <100 mg/dL   VLDL Chol Calc Piccolo,Waive 31 (H) <30 mg/dL  Uric acid  Result Value Ref Scott   Uric Acid 4.7 3.7 - 8.6 mg/dL      Assessment & Plan:   Problem List Items Addressed This Visit      Cardiovascular and Mediastinum   Essential hypertension    The current medical regimen is effective;  continue present plan and medications.       Relevant Medications   benazepril (LOTENSIN) 40 MG tablet   simvastatin (ZOCOR) 40 MG tablet   Other Relevant Orders   CBC  with Differential/Platelet   Comprehensive metabolic panel   Urinalysis, Routine w reflex microscopic     Respiratory   Obstructive sleep apnea    The current medical regimen is effective;  continue present plan and medications.         Endocrine   Hyperlipidemia associated with type 2 diabetes mellitus (HCC)   Relevant Medications   benazepril (LOTENSIN) 40 MG tablet   simvastatin (ZOCOR) 40 MG tablet   Other Relevant Orders   CBC with Differential/Platelet   Comprehensive metabolic panel     Genitourinary   Benign prostatic hyperplasia with urinary obstruction   Relevant Medications   finasteride (PROSCAR) 5 MG tablet   Other Relevant Orders   PSA     Other   Gout    The current medical regimen is effective;  continue present plan and medications.       Relevant Medications   allopurinol (ZYLOPRIM) 300 MG tablet   Hyperlipidemia    The current medical regimen is effective;  continue present plan and medications.       Relevant Medications   benazepril (LOTENSIN) 40 MG tablet   simvastatin (ZOCOR) 40 MG tablet   Other Relevant Orders   CBC with Differential/Platelet   Comprehensive metabolic panel   Lipid panel   Urinalysis, Routine w reflex microscopic   Advanced care planning/counseling discussion    A voluntary discussion about advance care planning including the explanation and discussion of advance directives was extensively discussed  with the patient.  Explanation about the health care proxy and Living will was reviewed and packet with forms with explanation of how to fill them out was given.  Patient has already completed all these documents       Other Visit Diagnoses    Annual physical exam    -  Primary   Relevant Orders   CBC with Differential/Platelet   Comprehensive metabolic panel   Lipid panel   PSA   TSH   Urinalysis, Routine w reflex microscopic   Screening cholesterol level       Relevant Orders   Lipid panel   Prostate cancer  screening       Relevant Orders   PSA   Thyroid disorder screen       Relevant Orders   TSH       Follow up plan: Return for BMP,  Lipids, ALT, AST.

## 2016-05-05 NOTE — Assessment & Plan Note (Signed)
The current medical regimen is effective;  continue present plan and medications.  

## 2016-05-06 ENCOUNTER — Telehealth: Payer: Self-pay | Admitting: Family Medicine

## 2016-05-06 DIAGNOSIS — N183 Chronic kidney disease, stage 3 unspecified: Secondary | ICD-10-CM

## 2016-05-06 DIAGNOSIS — R748 Abnormal levels of other serum enzymes: Secondary | ICD-10-CM

## 2016-05-06 LAB — LIPID PANEL
Chol/HDL Ratio: 3.7 ratio (ref 0.0–5.0)
Cholesterol, Total: 176 mg/dL (ref 100–199)
HDL: 47 mg/dL (ref >39)
LDL Calculated: 86 mg/dL (ref 0–99)
Triglycerides: 215 mg/dL — ABNORMAL HIGH (ref 0–149)
VLDL CHOLESTEROL CAL: 43 mg/dL — AB (ref 5–40)

## 2016-05-06 LAB — CBC WITH DIFFERENTIAL/PLATELET
BASOS: 0 %
Basophils Absolute: 0 10*3/uL (ref 0.0–0.2)
EOS (ABSOLUTE): 0 10*3/uL (ref 0.0–0.4)
EOS: 0 %
HEMATOCRIT: 48.1 % (ref 37.5–51.0)
Hemoglobin: 16.1 g/dL (ref 13.0–17.7)
IMMATURE GRANS (ABS): 0 10*3/uL (ref 0.0–0.1)
Immature Granulocytes: 0 %
LYMPHS: 23 %
Lymphocytes Absolute: 2 10*3/uL (ref 0.7–3.1)
MCH: 30 pg (ref 26.6–33.0)
MCHC: 33.5 g/dL (ref 31.5–35.7)
MCV: 90 fL (ref 79–97)
MONOCYTES: 6 %
Monocytes Absolute: 0.6 10*3/uL (ref 0.1–0.9)
NEUTROS ABS: 6 10*3/uL (ref 1.4–7.0)
Neutrophils: 71 %
Platelets: 228 10*3/uL (ref 150–379)
RBC: 5.36 x10E6/uL (ref 4.14–5.80)
RDW: 13.9 % (ref 12.3–15.4)
WBC: 8.6 10*3/uL (ref 3.4–10.8)

## 2016-05-06 LAB — COMPREHENSIVE METABOLIC PANEL
A/G RATIO: 1.8 (ref 1.2–2.2)
ALBUMIN: 5.1 g/dL — AB (ref 3.6–4.8)
ALT: 47 IU/L — ABNORMAL HIGH (ref 0–44)
AST: 30 IU/L (ref 0–40)
Alkaline Phosphatase: 72 IU/L (ref 39–117)
BILIRUBIN TOTAL: 0.3 mg/dL (ref 0.0–1.2)
BUN / CREAT RATIO: 16 (ref 10–24)
BUN: 21 mg/dL (ref 8–27)
CHLORIDE: 97 mmol/L (ref 96–106)
CO2: 24 mmol/L (ref 18–29)
Calcium: 10.4 mg/dL — ABNORMAL HIGH (ref 8.6–10.2)
Creatinine, Ser: 1.34 mg/dL — ABNORMAL HIGH (ref 0.76–1.27)
GFR, EST AFRICAN AMERICAN: 64 mL/min/{1.73_m2} (ref >59)
GFR, EST NON AFRICAN AMERICAN: 55 mL/min/{1.73_m2} — AB (ref >59)
Globulin, Total: 2.8 g/dL (ref 1.5–4.5)
Glucose: 107 mg/dL — ABNORMAL HIGH (ref 65–99)
POTASSIUM: 4.8 mmol/L (ref 3.5–5.2)
Sodium: 142 mmol/L (ref 134–144)
TOTAL PROTEIN: 7.9 g/dL (ref 6.0–8.5)

## 2016-05-06 LAB — PSA: PROSTATE SPECIFIC AG, SERUM: 0.2 ng/mL (ref 0.0–4.0)

## 2016-05-06 LAB — TSH: TSH: 2.26 u[IU]/mL (ref 0.450–4.500)

## 2016-05-06 NOTE — Telephone Encounter (Signed)
Phone call Discussed with patient slight decline in renal function slight elevation of liver enzymes. Patient not taking any extra Advil and Aleve but is taking aspirin once a day we'll discontinue aspirin recheck CMP 1 month.

## 2016-05-22 ENCOUNTER — Ambulatory Visit: Payer: BC Managed Care – PPO | Admitting: Pulmonary Disease

## 2016-05-29 ENCOUNTER — Other Ambulatory Visit: Payer: Self-pay | Admitting: Family Medicine

## 2016-05-29 NOTE — Telephone Encounter (Signed)
LV: 05/05/16

## 2016-06-08 ENCOUNTER — Other Ambulatory Visit: Payer: Medicare Other

## 2016-06-08 DIAGNOSIS — R748 Abnormal levels of other serum enzymes: Secondary | ICD-10-CM

## 2016-06-08 DIAGNOSIS — N183 Chronic kidney disease, stage 3 unspecified: Secondary | ICD-10-CM

## 2016-06-09 LAB — COMPREHENSIVE METABOLIC PANEL
A/G RATIO: 1.9 (ref 1.2–2.2)
ALBUMIN: 4.7 g/dL (ref 3.6–4.8)
ALK PHOS: 65 IU/L (ref 39–117)
ALT: 40 IU/L (ref 0–44)
AST: 35 IU/L (ref 0–40)
BUN / CREAT RATIO: 17 (ref 10–24)
BUN: 22 mg/dL (ref 8–27)
Bilirubin Total: 0.4 mg/dL (ref 0.0–1.2)
CHLORIDE: 101 mmol/L (ref 96–106)
CO2: 26 mmol/L (ref 18–29)
Calcium: 9.5 mg/dL (ref 8.6–10.2)
Creatinine, Ser: 1.26 mg/dL (ref 0.76–1.27)
GFR calc non Af Amer: 59 mL/min/{1.73_m2} — ABNORMAL LOW (ref >59)
GFR, EST AFRICAN AMERICAN: 69 mL/min/{1.73_m2} (ref >59)
Globulin, Total: 2.5 g/dL (ref 1.5–4.5)
Glucose: 89 mg/dL (ref 65–99)
POTASSIUM: 4.3 mmol/L (ref 3.5–5.2)
SODIUM: 141 mmol/L (ref 134–144)
TOTAL PROTEIN: 7.2 g/dL (ref 6.0–8.5)

## 2016-06-10 ENCOUNTER — Encounter: Payer: Self-pay | Admitting: Family Medicine

## 2016-06-21 ENCOUNTER — Encounter: Payer: Self-pay | Admitting: Pulmonary Disease

## 2016-06-22 ENCOUNTER — Encounter: Payer: Self-pay | Admitting: Pulmonary Disease

## 2016-06-22 ENCOUNTER — Ambulatory Visit (INDEPENDENT_AMBULATORY_CARE_PROVIDER_SITE_OTHER): Payer: Medicare Other | Admitting: Pulmonary Disease

## 2016-06-22 DIAGNOSIS — G4733 Obstructive sleep apnea (adult) (pediatric): Secondary | ICD-10-CM

## 2016-06-22 DIAGNOSIS — I10 Essential (primary) hypertension: Secondary | ICD-10-CM | POA: Diagnosis not present

## 2016-06-22 NOTE — Progress Notes (Signed)
   Subjective:    Patient ID: Eugene Scott, male    DOB: 02/28/1950, 66 y.o.   MRN: [id number]  HPI  66 year old for follow-up of OSA  He has been maintained on CPAP since 2001 and current new machine in 2016  His prior auto CPAP settings showed average pressure of 13 cm. He is able to sleep well with his CPAP, nasal mask with chinstrap, denies problems with mask or pressure. He does not have much nasal dryness. He has a So clean  machine and likes that.  Download shows good control of events with residual AHI 6/hour on auto CPAP 5-20 cm with average pressure of 16 cm and minimal leak. Compliance is excellent with more than 8 hours per night   NPSG 2001:  AHI 43/hr autotitration to 11cm 2010  Review of Systems Patient denies significant dyspnea,cough, hemoptysis,  chest pain, palpitations, pedal edema, orthopnea, paroxysmal nocturnal dyspnea, lightheadedness, nausea, vomiting, abdominal or  leg pains      Objective:   Physical Exam  Gen. Pleasant, obese, in no distress ENT - no lesions, no post nasal drip Neck: No JVD, no thyromegaly, no carotid bruits Lungs: no use of accessory muscles, no dullness to percussion, decreased without rales or rhonchi  Cardiovascular: Rhythm regular, heart sounds  normal, no murmurs or gallops, no peripheral edema Musculoskeletal: No deformities, no cyanosis or clubbing , no tremors       Assessment & Plan:

## 2016-06-22 NOTE — Assessment & Plan Note (Signed)
No cough with benazepril Pneumovax up-to-date

## 2016-06-22 NOTE — Assessment & Plan Note (Signed)
Change auto CPAP settings to 10-18 cm  Weight loss encouraged, compliance with goal of at least 4-6 hrs every night is the expectation. Advised against medications with sedative side effects Cautioned against driving when sleepy - understanding that sleepiness will vary on a day to day basis

## 2016-06-22 NOTE — Addendum Note (Signed)
Addended by: Valerie Salts on: 06/22/2016 03:12 PM   Modules accepted: Orders

## 2016-06-22 NOTE — Patient Instructions (Signed)
Change auto CPAP settings to 10-18 cm

## 2016-10-20 ENCOUNTER — Ambulatory Visit (INDEPENDENT_AMBULATORY_CARE_PROVIDER_SITE_OTHER): Payer: Medicare Other

## 2016-10-20 DIAGNOSIS — Z23 Encounter for immunization: Secondary | ICD-10-CM

## 2016-11-16 ENCOUNTER — Ambulatory Visit: Payer: Medicare Other | Admitting: Family Medicine

## 2016-11-16 ENCOUNTER — Encounter: Payer: Self-pay | Admitting: Family Medicine

## 2016-11-16 VITALS — BP 122/70 | HR 62 | Temp 97.6°F | Wt 223.4 lb

## 2016-11-16 DIAGNOSIS — I10 Essential (primary) hypertension: Secondary | ICD-10-CM

## 2016-11-16 DIAGNOSIS — E785 Hyperlipidemia, unspecified: Secondary | ICD-10-CM

## 2016-11-16 LAB — LP+ALT+AST PICCOLO, WAIVED
ALT (SGPT) PICCOLO, WAIVED: 44 U/L (ref 10–47)
AST (SGOT) PICCOLO, WAIVED: 31 U/L (ref 11–38)
CHOL/HDL RATIO PICCOLO,WAIVE: 3.5 mg/dL
Cholesterol Piccolo, Waived: 152 mg/dL (ref <200)
HDL Chol Piccolo, Waived: 43 mg/dL — ABNORMAL LOW (ref >59)
LDL Chol Calc Piccolo Waived: 68 mg/dL (ref <100)
Triglycerides Piccolo,Waived: 206 mg/dL — ABNORMAL HIGH (ref <150)
VLDL CHOL CALC PICCOLO,WAIVE: 41 mg/dL — AB (ref <30)

## 2016-11-16 MED ORDER — BENAZEPRIL HCL 40 MG PO TABS: 40.0000 mg | ORAL_TABLET | Freq: Every day | ORAL | 2 refills | Status: DC

## 2016-11-16 NOTE — Assessment & Plan Note (Signed)
The current medical regimen is effective;  continue present plan and medications.  

## 2016-11-16 NOTE — Progress Notes (Signed)
BP 122/70 (BP Location: Left Arm, Patient Position: Sitting, Cuff Size: Normal)   Pulse 62   Temp 97.6 F (36.4 C)   Wt 223 lb 6.4 oz (101.3 kg)   SpO2 99%   BMI 33.97 kg/m    Subjective:    Patient ID: Eugene Scott, male    DOB: 09/08/1950, 66 y.o.   MRN: [id number]  HPI: Eugene Scott is a 66 y.o. male  Chief Complaint  Patient presents with  . Follow-up  . Hypertension  . Hyperlipidemia   Patient follow-up hypertension and hypercholesterol doing well with no complaints. Taking medications faithfully without problems. Stopped aspirin because of slight decline in renal function doing well without aspirin.  Relevant past medical, surgical, family and social history reviewed and updated as indicated. Interim medical history since our last visit reviewed. Allergies and medications reviewed and updated.  Review of Systems  Constitutional: Negative.   Respiratory: Negative.   Cardiovascular: Negative.     Per HPI unless specifically indicated above     Objective:    BP 122/70 (BP Location: Left Arm, Patient Position: Sitting, Cuff Size: Normal)   Pulse 62   Temp 97.6 F (36.4 C)   Wt 223 lb 6.4 oz (101.3 kg)   SpO2 99%   BMI 33.97 kg/m   Wt Readings from Last 3 Encounters:  11/16/16 223 lb 6.4 oz (101.3 kg)  06/22/16 219 lb 6.4 oz (99.5 kg)  05/05/16 224 lb (101.6 kg)    Physical Exam  Constitutional: He is oriented to person, place, and time. He appears well-developed and well-nourished.  HENT:  Head: Normocephalic and atraumatic.  Eyes: Conjunctivae and EOM are normal.  Neck: Normal range of motion.  Cardiovascular: Normal rate, regular rhythm and normal heart sounds.  Pulmonary/Chest: Effort normal and breath sounds normal.  Musculoskeletal: Normal range of motion.  Neurological: He is alert and oriented to person, place, and time.  Skin: No erythema.  Psychiatric: He has a normal mood and affect. His behavior is normal. Judgment and  thought content normal.    Results for orders placed or performed in visit on 06/08/16  Comprehensive metabolic panel  Result Value Ref Range   Glucose 89 65 - 99 mg/dL   BUN 22 8 - 27 mg/dL   Creatinine, Ser 1.26 0.76 - 1.27 mg/dL   GFR calc non Af Amer 59 (L) >59 mL/min/1.73   GFR calc Af Amer 69 >59 mL/min/1.73   BUN/Creatinine Ratio 17 10 - 24   Sodium 141 134 - 144 mmol/L   Potassium 4.3 3.5 - 5.2 mmol/L   Chloride 101 96 - 106 mmol/L   CO2 26 18 - 29 mmol/L   Calcium 9.5 8.6 - 10.2 mg/dL   Total Protein 7.2 6.0 - 8.5 g/dL   Albumin 4.7 3.6 - 4.8 g/dL   Globulin, Total 2.5 1.5 - 4.5 g/dL   Albumin/Globulin Ratio 1.9 1.2 - 2.2   Bilirubin Total 0.4 0.0 - 1.2 mg/dL   Alkaline Phosphatase 65 39 - 117 IU/L   AST 35 0 - 40 IU/L   ALT 40 0 - 44 IU/L      Assessment & Plan:   Problem List Items Addressed This Visit      Cardiovascular and Mediastinum   Essential hypertension - Primary    The current medical regimen is effective;  continue present plan and medications.       Relevant Medications   benazepril (LOTENSIN) 40 MG tablet   Other Relevant  Orders   Basic metabolic panel   LP+ALT+AST Piccolo, Waived     Other   Hyperlipidemia    The current medical regimen is effective;  continue present plan and medications.       Relevant Medications   benazepril (LOTENSIN) 40 MG tablet   Other Relevant Orders   Basic metabolic panel   LP+ALT+AST Piccolo, Waived       Follow up plan: Return in about 6 months (around 05/16/2017) for Physical Exam.

## 2016-11-17 ENCOUNTER — Encounter: Payer: Self-pay | Admitting: Family Medicine

## 2016-11-17 LAB — BASIC METABOLIC PANEL
BUN / CREAT RATIO: 13 (ref 10–24)
BUN: 15 mg/dL (ref 8–27)
CHLORIDE: 102 mmol/L (ref 96–106)
CO2: 24 mmol/L (ref 20–29)
Calcium: 9.2 mg/dL (ref 8.6–10.2)
Creatinine, Ser: 1.2 mg/dL (ref 0.76–1.27)
GFR calc non Af Amer: 63 mL/min/{1.73_m2} (ref >59)
GFR, EST AFRICAN AMERICAN: 73 mL/min/{1.73_m2} (ref >59)
Glucose: 130 mg/dL — ABNORMAL HIGH (ref 65–99)
POTASSIUM: 4.7 mmol/L (ref 3.5–5.2)
Sodium: 143 mmol/L (ref 134–144)

## 2017-03-30 ENCOUNTER — Ambulatory Visit: Payer: Medicare Other | Admitting: Family Medicine

## 2017-03-30 ENCOUNTER — Encounter: Payer: Self-pay | Admitting: Family Medicine

## 2017-03-30 VITALS — BP 128/79 | HR 62 | Wt 218.0 lb

## 2017-03-30 DIAGNOSIS — R55 Syncope and collapse: Secondary | ICD-10-CM

## 2017-03-30 NOTE — Progress Notes (Signed)
BP 128/79   Pulse 62   Wt 218 lb (98.9 kg)   SpO2 99%   BMI 33.15 kg/m    Subjective:    Patient ID: Eugene Scott, male    DOB: December 01, 1950, 67 y.o.   MRN: [id number]  HPI: Eugene Scott is a 67 y.o. male  Chief Complaint  Patient presents with  . Passed out  Patient 3 days ago had a syncopal episode.  Events surrounding the syncopal episode included 4 days ago doing a 5K race.  That evening had upset stomach about 3 AM with diarrhea profuse sweating abdominal cramping.  Stood up and subsequently passed out.  Woke up with wife attending him.  There is no trauma from fall patient subsequently woke up and is been fine ever since.  Is not noticed any further GI discomfort or cardiac symptoms.  No further syncopal or lightheaded episodes.  No blood in stool or urine. Has continued blood pressure medication.  Relevant past medical, surgical, family and social history reviewed and updated as indicated. Interim medical history since our last visit reviewed. Allergies and medications reviewed and updated.  Review of Systems  Constitutional: Negative.   Respiratory: Negative.   Cardiovascular: Negative.     Per HPI unless specifically indicated above     Objective:    BP 128/79   Pulse 62   Wt 218 lb (98.9 kg)   SpO2 99%   BMI 33.15 kg/m   Wt Readings from Last 3 Encounters:  03/30/17 218 lb (98.9 kg)  11/16/16 223 lb 6.4 oz (101.3 kg)  06/22/16 219 lb 6.4 oz (99.5 kg)    Physical Exam  Constitutional: He is oriented to person, place, and time. He appears well-developed and well-nourished.  HENT:  Head: Normocephalic and atraumatic.  Eyes: Conjunctivae and EOM are normal.  Neck: Normal range of motion.  Cardiovascular: Normal rate, regular rhythm and normal heart sounds.  Pulmonary/Chest: Effort normal and breath sounds normal. No respiratory distress. He has no wheezes.  Abdominal: He exhibits no distension and no mass. There is no tenderness. There is  no rebound and no guarding.  Musculoskeletal: Normal range of motion.  Neurological: He is alert and oriented to person, place, and time.  Skin: No erythema.  Psychiatric: He has a normal mood and affect. His behavior is normal. Judgment and thought content normal.    Results for orders placed or performed in visit on 24/58/09  Basic metabolic panel  Result Value Ref Range   Glucose 130 (H) 65 - 99 mg/dL   BUN 15 8 - 27 mg/dL   Creatinine, Ser 1.20 0.76 - 1.27 mg/dL   GFR calc non Af Amer 63 >59 mL/min/1.73   GFR calc Af Amer 73 >59 mL/min/1.73   BUN/Creatinine Ratio 13 10 - 24   Sodium 143 134 - 144 mmol/L   Potassium 4.7 3.5 - 5.2 mmol/L   Chloride 102 96 - 106 mmol/L   CO2 24 20 - 29 mmol/L   Calcium 9.2 8.6 - 10.2 mg/dL  LP+ALT+AST Piccolo, Waived  Result Value Ref Range   ALT (SGPT) Piccolo, Waived 44 10 - 47 U/L   AST (SGOT) Piccolo, Waived 31 11 - 38 U/L   Cholesterol Piccolo, Waived 152 <200 mg/dL   HDL Chol Piccolo, Waived 43 (L) >59 mg/dL   Triglycerides Piccolo,Waived 206 (H) <150 mg/dL   Chol/HDL Ratio Piccolo,Waive 3.5 mg/dL   LDL Chol Calc Piccolo Waived 68 <100 mg/dL   VLDL Chol Calc  Piccolo,Waive 41 (H) <30 mg/dL      Assessment & Plan:   Problem List Items Addressed This Visit    None    Visit Diagnoses    Vasovagal syncope    -  Primary   Relevant Orders   EKG 12-Lead (Completed)    Normal sinus rhythm on EKG  Discussed vagovagal syncope combination of dehydration GI upset cramping retention of medications orthostatic hypotension resulting in syncope. Patient education given follow-up if problems.  Follow up plan: Return if symptoms worsen or fail to improve, for As scheduled.

## 2017-05-10 ENCOUNTER — Ambulatory Visit (INDEPENDENT_AMBULATORY_CARE_PROVIDER_SITE_OTHER): Payer: Medicare Other

## 2017-05-10 VITALS — BP 112/68 | HR 81 | Temp 98.1°F | Resp 16 | Ht 67.5 in | Wt 216.7 lb

## 2017-05-10 DIAGNOSIS — Z0000 Encounter for general adult medical examination without abnormal findings: Secondary | ICD-10-CM

## 2017-05-10 NOTE — Patient Instructions (Signed)
Eugene Scott , Thank you for taking time to come for your Medicare Wellness Visit. I appreciate your ongoing commitment to your health goals. Please review the following plan we discussed and let me know if I can assist you in the future.   Screening recommendations/referrals: Colonoscopy: completed 02/09/2014 Recommended yearly ophthalmology/optometry visit for glaucoma screening and checkup Recommended yearly dental visit for hygiene and checkup  Vaccinations: Influenza vaccine: due 09/2017 Pneumococcal vaccine:request to be done tomorrow. Tdap vaccine: up to date Shingles vaccine: up to date   Advanced directives: Please bring a copy of your health care power of attorney and living will to the office at your convenience.  Conditions/risks identified: Recommend drinking at least 6-8 glasses of water a day   Next appointment: Follow up on 05/11/2017 at 9:00am with Dr.Crissman. Follow up in one year for your annual wellness exam.   Preventive Care 65 Years and Older, Male Preventive care refers to lifestyle choices and visits with your health care provider that can promote health and wellness. What does preventive care include?  A yearly physical exam. This is also called an annual well check.  Dental exams once or twice a year.  Routine eye exams. Ask your health care provider how often you should have your eyes checked.  Personal lifestyle choices, including:  Daily care of your teeth and gums.  Regular physical activity.  Eating a healthy diet.  Avoiding tobacco and drug use.  Limiting alcohol use.  Practicing safe sex.  Taking low doses of aspirin every day.  Taking vitamin and mineral supplements as recommended by your health care provider. What happens during an annual well check? The services and screenings done by your health care provider during your annual well check will depend on your age, overall health, lifestyle risk factors, and family history of  disease. Counseling  Your health care provider may ask you questions about your:  Alcohol use.  Tobacco use.  Drug use.  Emotional well-being.  Home and relationship well-being.  Sexual activity.  Eating habits.  History of falls.  Memory and ability to understand (cognition).  Work and work Statistician. Screening  You may have the following tests or measurements:  Height, weight, and BMI.  Blood pressure.  Lipid and cholesterol levels. These may be checked every 5 years, or more frequently if you are over 47 years old.  Skin check.  Lung cancer screening. You may have this screening every year starting at age 68 if you have a 30-pack-year history of smoking and currently smoke or have quit within the past 15 years.  Fecal occult blood test (FOBT) of the stool. You may have this test every year starting at age 13.  Flexible sigmoidoscopy or colonoscopy. You may have a sigmoidoscopy every 5 years or a colonoscopy every 10 years starting at age 39.  Prostate cancer screening. Recommendations will vary depending on your family history and other risks.  Hepatitis C blood test.  Hepatitis B blood test.  Sexually transmitted disease (STD) testing.  Diabetes screening. This is done by checking your blood sugar (glucose) after you have not eaten for a while (fasting). You may have this done every 1-3 years.  Abdominal aortic aneurysm (AAA) screening. You may need this if you are a current or former smoker.  Osteoporosis. You may be screened starting at age 55 if you are at high risk. Talk with your health care provider about your test results, treatment options, and if necessary, the need for more tests. Vaccines  Your health care provider may recommend certain vaccines, such as:  Influenza vaccine. This is recommended every year.  Tetanus, diphtheria, and acellular pertussis (Tdap, Td) vaccine. You may need a Td booster every 10 years.  Zoster vaccine. You may  need this after age 54.  Pneumococcal 13-valent conjugate (PCV13) vaccine. One dose is recommended after age 45.  Pneumococcal polysaccharide (PPSV23) vaccine. One dose is recommended after age 101. Talk to your health care provider about which screenings and vaccines you need and how often you need them. This information is not intended to replace advice given to you by your health care provider. Make sure you discuss any questions you have with your health care provider. Document Released: 01/18/2015 Document Revised: 09/11/2015 Document Reviewed: 10/23/2014 Elsevier Interactive Patient Education  2017 [place] Prevention in the Home Falls can cause injuries. They can happen to people of all ages. There are many things you can do to make your home safe and to help prevent falls. What can I do on the outside of my home?  Regularly fix the edges of walkways and driveways and fix any cracks.  Remove anything that might make you trip as you walk through a door, such as a raised step or threshold.  Trim any bushes or trees on the path to your home.  Use bright outdoor lighting.  Clear any walking paths of anything that might make someone trip, such as rocks or tools.  Regularly check to see if handrails are loose or broken. Make sure that both sides of any steps have handrails.  Any raised decks and porches should have guardrails on the edges.  Have any leaves, snow, or ice cleared regularly.  Use sand or salt on walking paths during winter.  Clean up any spills in your garage right away. This includes oil or grease spills. What can I do in the bathroom?  Use night lights.  Install grab bars by the toilet and in the tub and shower. Do not use towel bars as grab bars.  Use non-skid mats or decals in the tub or shower.  If you need to sit down in the shower, use a plastic, non-slip stool.  Keep the floor dry. Clean up any water that spills on the floor as soon as it  happens.  Remove soap buildup in the tub or shower regularly.  Attach bath mats securely with double-sided non-slip rug tape.  Do not have throw rugs and other things on the floor that can make you trip. What can I do in the bedroom?  Use night lights.  Make sure that you have a light by your bed that is easy to reach.  Do not use any sheets or blankets that are too big for your bed. They should not hang down onto the floor.  Have a firm chair that has side arms. You can use this for support while you get dressed.  Do not have throw rugs and other things on the floor that can make you trip. What can I do in the kitchen?  Clean up any spills right away.  Avoid walking on wet floors.  Keep items that you use a lot in easy-to-reach places.  If you need to reach something above you, use a strong step stool that has a grab bar.  Keep electrical cords out of the way.  Do not use floor polish or wax that makes floors slippery. If you must use wax, use non-skid floor wax.  Do  not have throw rugs and other things on the floor that can make you trip. What can I do with my stairs?  Do not leave any items on the stairs.  Make sure that there are handrails on both sides of the stairs and use them. Fix handrails that are broken or loose. Make sure that handrails are as long as the stairways.  Check any carpeting to make sure that it is firmly attached to the stairs. Fix any carpet that is loose or worn.  Avoid having throw rugs at the top or bottom of the stairs. If you do have throw rugs, attach them to the floor with carpet tape.  Make sure that you have a light switch at the top of the stairs and the bottom of the stairs. If you do not have them, ask someone to add them for you. What else can I do to help prevent falls?  Wear shoes that:  Do not have high heels.  Have rubber bottoms.  Are comfortable and fit you well.  Are closed at the toe. Do not wear sandals.  If you  use a stepladder:  Make sure that it is fully opened. Do not climb a closed stepladder.  Make sure that both sides of the stepladder are locked into place.  Ask someone to hold it for you, if possible.  Clearly mark and make sure that you can see:  Any grab bars or handrails.  First and last steps.  Where the edge of each step is.  Use tools that help you move around (mobility aids) if they are needed. These include:  Canes.  Walkers.  Scooters.  Crutches.  Turn on the lights when you go into a dark area. Replace any light bulbs as soon as they burn out.  Set up your furniture so you have a clear path. Avoid moving your furniture around.  If any of your floors are uneven, fix them.  If there are any pets around you, be aware of where they are.  Review your medicines with your doctor. Some medicines can make you feel dizzy. This can increase your chance of falling. Ask your doctor what other things that you can do to help prevent falls. This information is not intended to replace advice given to you by your health care provider. Make sure you discuss any questions you have with your health care provider. Document Released: 10/18/2008 Document Revised: 05/30/2015 Document Reviewed: 01/26/2014 Elsevier Interactive Patient Education  2017 [employer].

## 2017-05-10 NOTE — Progress Notes (Signed)
Subjective:   Eugene Scott is a 67 y.o. male who presents for an Initial Medicare Annual Wellness Visit.  Review of Systems   Cardiac Risk Factors include: male gender;hypertension;advanced age (>64men, >4 women);obesity (BMI >30kg/m2)    Objective:    Today's Vitals   05/10/17 1055 05/10/17 1057  BP: 112/68   Pulse: 81   Resp: 16   Temp: 98.1 F (36.7 C)   TempSrc: Temporal   SpO2: 98%   Weight: 216 lb 11.2 oz (98.3 kg)   Height: 5' 7.5" (1.715 m)   PainSc:  0-No pain   Body mass index is 33.44 kg/m.  Advanced Directives 05/10/2017  Does Patient Have a Medical Advance Directive? Yes  Type of Paramedic of [place];Living will  Copy of [place] in Chart? No - copy requested    Current Medications (verified) Outpatient Encounter Medications as of 05/10/2017  Medication Sig  . allopurinol (ZYLOPRIM) 300 MG tablet Take 1 tablet (300 mg total) by mouth daily.  . benazepril (LOTENSIN) 40 MG tablet Take 1 tablet (40 mg total) daily by mouth.  . finasteride (PROSCAR) 5 MG tablet Take 1 tablet (5 mg total) by mouth daily.  . Melatonin 1 MG CAPS Take by mouth as needed.  . simvastatin (ZOCOR) 40 MG tablet Take 1 tablet (40 mg total) by mouth daily.  . valACYclovir (VALTREX) 1000 MG tablet Take 1 tablet (1,000 mg total) by mouth 2 (two) times daily as needed. (Patient not taking: Reported on 05/10/2017)   No facility-administered encounter medications on file as of 05/10/2017.     Allergies (verified) Patient has no known allergies.   History: Past Medical History:  Diagnosis Date  . Allergy   . Benign hematuria   . BPH (benign prostatic hypertrophy) with urinary obstruction   . Cancer ([place])    skin  . Gout   . Hyperlipidemia    Past Surgical History:  Procedure Laterality Date  . FRACTURE SURGERY Right    elbow  . STRABISMUS SURGERY     x3   Family History  Problem Relation Age of Onset  . Cancer Father      prostate  . Heart disease Father   . Osteoporosis Mother    Social History   Socioeconomic History  . Marital status: Married    Spouse name: Not on file  . Number of children: Not on file  . Years of education: Not on file  . Highest education level: Not on file  Occupational History  . Not on file  Social Needs  . Financial resource strain: Not hard at all  . Food insecurity:    Worry: Never true    Inability: Never true  . Transportation needs:    Medical: No    Non-medical: No  Tobacco Use  . Smoking status: Never Smoker  . Smokeless tobacco: Never Used  Substance and Sexual Activity  . Alcohol use: Yes    Comment: social  . Drug use: No  . Sexual activity: Not on file  Lifestyle  . Physical activity:    Days per week: 3 days    Minutes per session: 30 min  . Stress: Not at all  Relationships  . Social connections:    Talks on phone: More than three times a week    Gets together: More than three times a week    Attends religious service: More than 4 times per year    Active member of club  or organization: Yes    Attends meetings of clubs or organizations: More than 4 times per year    Relationship status: Married  Other Topics Concern  . Not on file  Social History Narrative  . Not on file   Tobacco Counseling Counseling given: Not Answered   Clinical Intake:  Pre-visit preparation completed: Yes  Pain : No/denies pain Pain Score: 0-No pain     Nutritional Status: BMI > 30  Obese Nutritional Risks: None Diabetes: No  How often do you need to have someone help you when you read instructions, pamphlets, or other written materials from your doctor or pharmacy?: 1 - Never What is the last grade level you completed in school?: doctorette   Interpreter Needed?: No  Information entered by :: [place]   Activities of Daily Living In your present state of health, do you have any difficulty performing the following activities: 05/10/2017    Hearing? N  Vision? N  Difficulty concentrating or making decisions? N  Walking or climbing stairs? N  Dressing or bathing? N  Doing errands, shopping? N  Preparing Food and eating ? N  Using the Toilet? N  In the past six months, have you accidently leaked urine? N  Do you have problems with loss of bowel control? N  Managing your Medications? N  Managing your Finances? N  Housekeeping or managing your Housekeeping? N  Some recent data might be hidden     Immunizations and Health Maintenance Immunization History  Administered Date(s) Administered  . Influenza Whole 10/29/2011  . Influenza, High Dose Seasonal PF 10/20/2016  . Influenza,inj,Quad PF,6+ Mos 10/10/2012  . Influenza-Unspecified 10/05/2013, 10/13/2014, 10/22/2015  . Pneumococcal Conjugate-13 05/05/2016  . Tdap 10/25/2012  . Zoster 04/02/2011  . Zoster Recombinat (Shingrix) 03/15/2017   Health Maintenance Due  Topic Date Due  . HEMOGLOBIN A1C  Jun 18, 1950  . FOOT EXAM  03/23/1961  . OPHTHALMOLOGY EXAM  03/23/1961  . PNA vac Low Risk Adult (2 of 2 - PPSV23) 05/05/2017    Patient Care Team: Guadalupe Maple, MD as PCP - General (Family Medicine)  Indicate any recent Medical Services you may have received from other than [facility] providers in the past year (date may be approximate).    Assessment:   This is a routine wellness examination for Eugene Scott.  Hearing/Vision screen Vision Screening Comments: [facility]   Dietary issues and exercise activities discussed: Current Exercise Habits: Home exercise routine, Type of exercise: walking;strength training/weights;Other - see comments(eliptical ), Time (Minutes): 60, Frequency (Times/Week): 3, Weekly Exercise (Minutes/Week): 180, Intensity: Moderate, Exercise limited by: None identified  Goals    . DIET - INCREASE WATER INTAKE     Recommend drinking at least 6-8 glasses of water a day       Depression Screen PHQ 2/9 Scores 05/10/2017 05/05/2016  12/04/2014  PHQ - 2 Score 0 0 0    Fall Risk Fall Risk  05/10/2017 03/30/2017 05/05/2016  Falls in the past year? Yes Yes No  Number falls in past yr: 1 1 -  Injury with Fall? No No -    Is the patient's home free of loose throw rugs in walkways, pet beds, electrical cords, etc?   yes      Grab bars in the bathroom? no      Handrails on the stairs?   yes      Adequate lighting?   yes  Timed Get Up and Go performed: Completed in 8 seconds  with no use of assistive devices, steady gait. No intervention needed at this time.   Cognitive Function:     6CIT Screen 05/10/2017  What Year? 0 points  What month? 0 points  What time? 0 points  Count back from 20 0 points  Months in reverse 0 points  Repeat phrase 2 points  Total Score 2    Screening Tests Health Maintenance  Topic Date Due  . HEMOGLOBIN A1C  12-10-1950  . FOOT EXAM  03/23/1961  . OPHTHALMOLOGY EXAM  03/23/1961  . PNA vac Low Risk Adult (2 of 2 - PPSV23) 05/05/2017  . INFLUENZA VACCINE  08/05/2017  . COLONOSCOPY  02/10/2019  . TETANUS/TDAP  10/26/2022  . Hepatitis C Screening  Completed    Qualifies for Shingles Vaccine?  Yes, discussed shingrix vaccine   Cancer Screenings: Lung: Low Dose CT Chest recommended if Age 30-80 years, 30 pack-year currently smoking OR have quit w/in 15years. Patient does not qualify. Colorectal: completed 02/09/2014  Additional Screenings:  Hepatitis C Screening: completed 12/04/2014      Plan:    I have personally reviewed and addressed the Medicare Annual Wellness questionnaire and have noted the following in the patient's chart:  A. Medical and social history B. Use of alcohol, tobacco or illicit drugs  C. Current medications and supplements D. Functional ability and status E.  Nutritional status F.  Physical activity G. Advance directives H. List of other physicians I.  Hospitalizations, surgeries, and ER visits in previous 12 months J.  [place] such as hearing  and vision if needed, cognitive and depression L. Referrals and appointments   In addition, I have reviewed and discussed with patient certain preventive protocols, quality metrics, and best practice recommendations. A written personalized care plan for preventive services as well as general preventive health recommendations were provided to patient.   Signed,  Tyler Aas, LPN Nurse Health Advisor   Nurse Notes: due for diabetic foot exam.  Had an eye exam done at [facility] Aletha Halim request results.

## 2017-05-11 ENCOUNTER — Ambulatory Visit (INDEPENDENT_AMBULATORY_CARE_PROVIDER_SITE_OTHER): Payer: Medicare Other | Admitting: Family Medicine

## 2017-05-11 VITALS — BP 110/73 | HR 59 | Ht 68.0 in | Wt 218.0 lb

## 2017-05-11 DIAGNOSIS — G4733 Obstructive sleep apnea (adult) (pediatric): Secondary | ICD-10-CM | POA: Diagnosis not present

## 2017-05-11 DIAGNOSIS — Z0000 Encounter for general adult medical examination without abnormal findings: Secondary | ICD-10-CM | POA: Diagnosis not present

## 2017-05-11 DIAGNOSIS — Z7189 Other specified counseling: Secondary | ICD-10-CM

## 2017-05-11 DIAGNOSIS — E1122 Type 2 diabetes mellitus with diabetic chronic kidney disease: Secondary | ICD-10-CM | POA: Diagnosis not present

## 2017-05-11 DIAGNOSIS — N183 Chronic kidney disease, stage 3 unspecified: Secondary | ICD-10-CM

## 2017-05-11 DIAGNOSIS — I129 Hypertensive chronic kidney disease with stage 1 through stage 4 chronic kidney disease, or unspecified chronic kidney disease: Secondary | ICD-10-CM | POA: Diagnosis not present

## 2017-05-11 DIAGNOSIS — M109 Gout, unspecified: Secondary | ICD-10-CM

## 2017-05-11 DIAGNOSIS — I10 Essential (primary) hypertension: Secondary | ICD-10-CM

## 2017-05-11 DIAGNOSIS — E785 Hyperlipidemia, unspecified: Secondary | ICD-10-CM | POA: Diagnosis not present

## 2017-05-11 DIAGNOSIS — N401 Enlarged prostate with lower urinary tract symptoms: Secondary | ICD-10-CM

## 2017-05-11 DIAGNOSIS — Z23 Encounter for immunization: Secondary | ICD-10-CM

## 2017-05-11 DIAGNOSIS — E1169 Type 2 diabetes mellitus with other specified complication: Secondary | ICD-10-CM

## 2017-05-11 DIAGNOSIS — E782 Mixed hyperlipidemia: Secondary | ICD-10-CM

## 2017-05-11 DIAGNOSIS — N138 Other obstructive and reflux uropathy: Secondary | ICD-10-CM

## 2017-05-11 DIAGNOSIS — Z1329 Encounter for screening for other suspected endocrine disorder: Secondary | ICD-10-CM | POA: Diagnosis not present

## 2017-05-11 LAB — URINALYSIS, ROUTINE W REFLEX MICROSCOPIC
Bilirubin, UA: NEGATIVE
Glucose, UA: NEGATIVE
KETONES UA: NEGATIVE
LEUKOCYTES UA: NEGATIVE
Nitrite, UA: NEGATIVE
Protein, UA: NEGATIVE
SPEC GRAV UA: 1.02 (ref 1.005–1.030)
Urobilinogen, Ur: 0.2 mg/dL (ref 0.2–1.0)
pH, UA: 5 (ref 5.0–7.5)

## 2017-05-11 LAB — MICROSCOPIC EXAMINATION: BACTERIA UA: NONE SEEN

## 2017-05-11 MED ORDER — FINASTERIDE 5 MG PO TABS: 5.0000 mg | ORAL_TABLET | Freq: Every day | ORAL | 4 refills | Status: DC

## 2017-05-11 MED ORDER — VALACYCLOVIR HCL 1 G PO TABS: 1000.0000 mg | ORAL_TABLET | Freq: Two times a day (BID) | ORAL | 6 refills | Status: DC | PRN

## 2017-05-11 MED ORDER — BENAZEPRIL HCL 40 MG PO TABS: 40.0000 mg | ORAL_TABLET | Freq: Every day | ORAL | 2 refills | Status: DC

## 2017-05-11 MED ORDER — ALLOPURINOL 300 MG PO TABS: 300.0000 mg | ORAL_TABLET | Freq: Every day | ORAL | 4 refills | Status: DC

## 2017-05-11 MED ORDER — SIMVASTATIN 40 MG PO TABS: 40.0000 mg | ORAL_TABLET | Freq: Every day | ORAL | 4 refills | Status: DC

## 2017-05-11 NOTE — Assessment & Plan Note (Signed)
Doing well using CPAP

## 2017-05-11 NOTE — Assessment & Plan Note (Signed)
The current medical regimen is effective;  continue present plan and medications.  

## 2017-05-11 NOTE — Progress Notes (Signed)
BP 110/73   Pulse (!) 59   Ht 5\' 8"  (1.727 m)   Wt 218 lb (98.9 kg)   SpO2 97%   BMI 33.15 kg/m    Subjective:    Patient ID: Eugene Scott, male    DOB: Jun 06, 1950, 67 y.o.   MRN: [id number]  HPI: Eugene Scott is a 67 y.o. male  Chief Complaint  Patient presents with  . Annual Exam    Saw Charlette Caffey  So patient all in all doing well no complaints taking gout medication without problems no gout symptoms blood pressure good control.  BPH no complaints and cholesterol no complaints.  Uses as needed melatonin and valacyclovir without problems.  Relevant past medical, surgical, family and social history reviewed and updated as indicated. Interim medical history since our last visit reviewed. Allergies and medications reviewed and updated.  Review of Systems  Constitutional: Negative.   HENT: Negative.   Eyes: Negative.   Respiratory: Negative.   Cardiovascular: Negative.   Gastrointestinal: Negative.   Endocrine: Negative.   Genitourinary: Negative.   Musculoskeletal: Negative.   Skin: Negative.   Allergic/Immunologic: Negative.   Neurological: Negative.   Hematological: Negative.   Psychiatric/Behavioral: Negative.     Per HPI unless specifically indicated above     Objective:    BP 110/73   Pulse (!) 59   Ht 5\' 8"  (1.727 m)   Wt 218 lb (98.9 kg)   SpO2 97%   BMI 33.15 kg/m   Wt Readings from Last 3 Encounters:  05/11/17 218 lb (98.9 kg)  05/10/17 216 lb 11.2 oz (98.3 kg)  03/30/17 218 lb (98.9 kg)    Physical Exam  Constitutional: He is oriented to person, place, and time. He appears well-developed and well-nourished.  HENT:  Head: Normocephalic and atraumatic.  Right Ear: External ear normal.  Left Ear: External ear normal.  Eyes: Pupils are equal, round, and reactive to light. Conjunctivae and EOM are normal.  Neck: Normal range of motion. Neck supple.  Cardiovascular: Normal rate, regular rhythm, normal heart sounds and  intact distal pulses.  Pulmonary/Chest: Effort normal and breath sounds normal.  Abdominal: Soft. Bowel sounds are normal. There is no splenomegaly or hepatomegaly.  Genitourinary: Rectum normal, prostate normal and penis normal.  Musculoskeletal: Normal range of motion.  Neurological: He is alert and oriented to person, place, and time. He has normal reflexes.  Skin: No rash noted. No erythema.  Psychiatric: He has a normal mood and affect. His behavior is normal. Judgment and thought content normal.    Results for orders placed or performed in visit on 75/10/25  Basic metabolic panel  Result Value Ref Range   Glucose 130 (H) 65 - 99 mg/dL   BUN 15 8 - 27 mg/dL   Creatinine, Ser 1.20 0.76 - 1.27 mg/dL   GFR calc non Af Amer 63 >59 mL/min/1.73   GFR calc Af Amer 73 >59 mL/min/1.73   BUN/Creatinine Ratio 13 10 - 24   Sodium 143 134 - 144 mmol/L   Potassium 4.7 3.5 - 5.2 mmol/L   Chloride 102 96 - 106 mmol/L   CO2 24 20 - 29 mmol/L   Calcium 9.2 8.6 - 10.2 mg/dL  LP+ALT+AST Piccolo, Waived  Result Value Ref Range   ALT (SGPT) Piccolo, Waived 44 10 - 47 U/L   AST (SGOT) Piccolo, Waived 31 11 - 38 U/L   Cholesterol Piccolo, Waived 152 <200 mg/dL   HDL Chol Piccolo, Waived 43 (L) >59  mg/dL   Triglycerides Piccolo,Waived 206 (H) <150 mg/dL   Chol/HDL Ratio Piccolo,Waive 3.5 mg/dL   LDL Chol Calc Piccolo Waived 68 <100 mg/dL   VLDL Chol Calc Piccolo,Waive 41 (H) <30 mg/dL      Assessment & Plan:   Problem List Items Addressed This Visit      Cardiovascular and Mediastinum   Essential hypertension - Primary   Relevant Medications   simvastatin (ZOCOR) 40 MG tablet   benazepril (LOTENSIN) 40 MG tablet   Other Relevant Orders   CBC with Differential/Platelet   Comprehensive metabolic panel   Lipid panel   PSA   TSH   Urinalysis, Routine w reflex microscopic     Respiratory   Obstructive sleep apnea    Doing well using CPAP        Genitourinary   Benign prostatic  hyperplasia with urinary obstruction    The current medical regimen is effective;  continue present plan and medications.       Relevant Medications   finasteride (PROSCAR) 5 MG tablet   Other Relevant Orders   PSA     Other   Gout    The current medical regimen is effective;  continue present plan and medications.       Relevant Medications   allopurinol (ZYLOPRIM) 300 MG tablet   Hyperlipidemia    The current medical regimen is effective;  continue present plan and medications.       Relevant Medications   simvastatin (ZOCOR) 40 MG tablet   benazepril (LOTENSIN) 40 MG tablet   Other Relevant Orders   CBC with Differential/Platelet   Comprehensive metabolic panel   Lipid panel   PSA   TSH   Urinalysis, Routine w reflex microscopic   CBC with Differential/Platelet   Comprehensive metabolic panel   Lipid panel   Urinalysis, Routine w reflex microscopic   Advanced care planning/counseling discussion    A voluntary discussion about advance care planning including the explanation and discussion of advance directives was extensively discussed  with the patient.  Explanation about the health care proxy and Living will was reviewed and packet with forms with explanation of how to fill them out was given.    Time spent:  Encounter 16+ min      Individuals present:Pt        Other Visit Diagnoses    CKD (chronic kidney disease) stage 3, GFR 30-59 ml/min (HCC)       Relevant Orders   CBC with Differential/Platelet   Comprehensive metabolic panel   Lipid panel   Urinalysis, Routine w reflex microscopic   Annual physical exam       Relevant Orders   CBC with Differential/Platelet   Comprehensive metabolic panel   Lipid panel   PSA   TSH   Urinalysis, Routine w reflex microscopic   Thyroid disorder screen       Relevant Orders   TSH   Hyperlipidemia associated with type 2 diabetes mellitus (HCC)       Relevant Medications   simvastatin (ZOCOR) 40 MG tablet    benazepril (LOTENSIN) 40 MG tablet       Follow up plan: Return in about 6 months (around 11/11/2017) for BMP,  Lipids, ALT, AST.

## 2017-05-11 NOTE — Addendum Note (Signed)
Addended by: Gerda Diss A on: 05/11/2017 10:05 AM   Modules accepted: Orders

## 2017-05-11 NOTE — Assessment & Plan Note (Signed)
A voluntary discussion about advance care planning including the explanation and discussion of advance directives was extensively discussed  with the patient.  Explanation about the health care proxy and Living will was reviewed and packet with forms with explanation of how to fill them out was given.   Time spent:  Encounter 16+ min      Individuals present: Pt  

## 2017-05-12 ENCOUNTER — Encounter: Payer: Self-pay | Admitting: Family Medicine

## 2017-05-12 LAB — CBC WITH DIFFERENTIAL/PLATELET
BASOS ABS: 0 10*3/uL (ref 0.0–0.2)
BASOS: 0 %
EOS (ABSOLUTE): 0.2 10*3/uL (ref 0.0–0.4)
Eos: 4 %
HEMATOCRIT: 45.6 % (ref 37.5–51.0)
HEMOGLOBIN: 15.7 g/dL (ref 13.0–17.7)
IMMATURE GRANS (ABS): 0 10*3/uL (ref 0.0–0.1)
Immature Granulocytes: 0 %
LYMPHS: 38 %
Lymphocytes Absolute: 2 10*3/uL (ref 0.7–3.1)
MCH: 30.8 pg (ref 26.6–33.0)
MCHC: 34.4 g/dL (ref 31.5–35.7)
MCV: 89 fL (ref 79–97)
MONOCYTES: 7 %
Monocytes Absolute: 0.4 10*3/uL (ref 0.1–0.9)
NEUTROS ABS: 2.7 10*3/uL (ref 1.4–7.0)
Neutrophils: 51 %
Platelets: 197 10*3/uL (ref 150–379)
RBC: 5.1 x10E6/uL (ref 4.14–5.80)
RDW: 13.8 % (ref 12.3–15.4)
WBC: 5.3 10*3/uL (ref 3.4–10.8)

## 2017-05-12 LAB — COMPREHENSIVE METABOLIC PANEL
ALT: 30 IU/L (ref 0–44)
AST: 24 IU/L (ref 0–40)
Albumin/Globulin Ratio: 1.9 (ref 1.2–2.2)
Albumin: 4.6 g/dL (ref 3.6–4.8)
Alkaline Phosphatase: 61 IU/L (ref 39–117)
BILIRUBIN TOTAL: 0.4 mg/dL (ref 0.0–1.2)
BUN/Creatinine Ratio: 19 (ref 10–24)
BUN: 24 mg/dL (ref 8–27)
CO2: 21 mmol/L (ref 20–29)
CREATININE: 1.25 mg/dL (ref 0.76–1.27)
Calcium: 9.3 mg/dL (ref 8.6–10.2)
Chloride: 103 mmol/L (ref 96–106)
GFR calc Af Amer: 69 mL/min/{1.73_m2} (ref >59)
GFR calc non Af Amer: 60 mL/min/{1.73_m2} (ref >59)
GLUCOSE: 116 mg/dL — AB (ref 65–99)
Globulin, Total: 2.4 g/dL (ref 1.5–4.5)
Potassium: 4.6 mmol/L (ref 3.5–5.2)
Sodium: 139 mmol/L (ref 134–144)
TOTAL PROTEIN: 7 g/dL (ref 6.0–8.5)

## 2017-05-12 LAB — LIPID PANEL
CHOLESTEROL TOTAL: 172 mg/dL (ref 100–199)
Chol/HDL Ratio: 4.2 ratio (ref 0.0–5.0)
HDL: 41 mg/dL (ref >39)
LDL CALC: 92 mg/dL (ref 0–99)
TRIGLYCERIDES: 195 mg/dL — AB (ref 0–149)
VLDL CHOLESTEROL CAL: 39 mg/dL (ref 5–40)

## 2017-05-12 LAB — PSA: Prostate Specific Ag, Serum: 0.2 ng/mL (ref 0.0–4.0)

## 2017-05-12 LAB — TSH: TSH: 2.85 u[IU]/mL (ref 0.450–4.500)

## 2017-06-25 ENCOUNTER — Ambulatory Visit: Payer: Medicare Other | Admitting: Adult Health

## 2017-06-28 ENCOUNTER — Encounter: Payer: Self-pay | Admitting: Adult Health

## 2017-06-28 ENCOUNTER — Ambulatory Visit: Payer: Medicare Other | Admitting: Adult Health

## 2017-06-28 VITALS — BP 120/64 | HR 63 | Ht 68.0 in | Wt 217.2 lb

## 2017-06-28 DIAGNOSIS — G4733 Obstructive sleep apnea (adult) (pediatric): Secondary | ICD-10-CM

## 2017-06-28 DIAGNOSIS — E669 Obesity, unspecified: Secondary | ICD-10-CM | POA: Diagnosis not present

## 2017-06-28 NOTE — Assessment & Plan Note (Signed)
Wt loss  

## 2017-06-28 NOTE — Patient Instructions (Signed)
Continue on CPAP At bedtime  .  Keep up good work .  Work on healthy weight.  Do not drive if sleepy  Follow up with Dr. Elsworth Soho  In 1 year and As needed

## 2017-06-28 NOTE — Progress Notes (Signed)
@Patient  ID: Eugene Scott, male    DOB: 06-05-1950, 67 y.o.   MRN: [id number]  Chief Complaint  Patient presents with  . Follow-up    OSA    Referring provider: Guadalupe Maple, MD  HPI: 67 year old male followed for obstructive sleep apnea  TEST  NPSG 2001: AHI 43/hr autotitration to 11cm 2010    06/28/2017 Follow up: OSA  Patient presents for a yearly follow-up for sleep apnea.  Patient says he is doing well on CPAP.  He says that he never misses a night.  He feels rested.  Download shows excellent compliance with average usage around 9 hours.  Patient is on auto CPAP 10 to 18 cm H2O.  AHI 4.6. Denies any daytime sleepiness.  No Known Allergies  Immunization History  Administered Date(s) Administered  . Influenza Whole 10/29/2011  . Influenza, High Dose Seasonal PF 10/20/2016  . Influenza,inj,Quad PF,6+ Mos 10/10/2012  . Influenza-Unspecified 10/05/2013, 10/13/2014, 10/22/2015  . Pneumococcal Conjugate-13 05/05/2016  . Pneumococcal Polysaccharide-23 05/11/2017  . Tdap 10/25/2012  . Zoster 04/02/2011  . Zoster Recombinat (Shingrix) 03/15/2017    Past Medical History:  Diagnosis Date  . Allergy   . Benign hematuria   . BPH (benign prostatic hypertrophy) with urinary obstruction   . Cancer ([place])    skin  . Gout   . Hyperlipidemia     Tobacco History: Social History   Tobacco Use  Smoking Status Never Smoker  Smokeless Tobacco Never Used   Counseling given: Not Answered   Outpatient Encounter Medications as of 06/28/2017  Medication Sig  . allopurinol (ZYLOPRIM) 300 MG tablet Take 1 tablet (300 mg total) by mouth daily.  . benazepril (LOTENSIN) 40 MG tablet Take 1 tablet (40 mg total) by mouth daily.  . finasteride (PROSCAR) 5 MG tablet Take 1 tablet (5 mg total) by mouth daily.  . Melatonin 1 MG CAPS Take by mouth as needed.  . simvastatin (ZOCOR) 40 MG tablet Take 1 tablet (40 mg total) by mouth daily.  . valACYclovir (VALTREX) 1000 MG  tablet Take 1 tablet (1,000 mg total) by mouth 2 (two) times daily as needed.   No facility-administered encounter medications on file as of 06/28/2017.      Review of Systems  Constitutional:   No  weight loss, night sweats,  Fevers, chills, fatigue, or  lassitude.  HEENT:   No headaches,  Difficulty swallowing,  Tooth/dental problems, or  Sore throat,                No sneezing, itching, ear ache, nasal congestion, post nasal drip,   CV:  No chest pain,  Orthopnea, PND, swelling in lower extremities, anasarca, dizziness, palpitations, syncope.   GI  No heartburn, indigestion, abdominal pain, nausea, vomiting, diarrhea, change in bowel habits, loss of appetite, bloody stools.   Resp: No shortness of breath with exertion or at rest.  No excess mucus, no productive cough,  No non-productive cough,  No coughing up of blood.  No change in color of mucus.  No wheezing.  No chest wall deformity  Skin: no rash or lesions.  GU: no dysuria, change in color of urine, no urgency or frequency.  No flank pain, no hematuria   MS:  No joint pain or swelling.  No decreased range of motion.  No back pain.    Physical Exam  BP 120/64 (BP Location: Left Arm, Cuff Size: Normal)   Pulse 63   Ht 5\' 8"  (1.727 m)  Wt 217 lb 3.2 oz (98.5 kg)   SpO2 97%   BMI 33.03 kg/m   GEN: A/Ox3; pleasant , NAD, well nourished    HEENT:  [place]/AT,  EACs-clear, TMs-wnl, NOSE-clear, THROAT-clear, no lesions, no postnasal drip or exudate noted. Class 2-3 MP airway   NECK:  Supple w/ fair ROM; no JVD; normal carotid impulses w/o bruits; no thyromegaly or nodules palpated; no lymphadenopathy.    RESP  Clear  P & A; w/o, wheezes/ rales/ or rhonchi. no accessory muscle use, no dullness to percussion  CARD:  RRR, no m/r/g, no peripheral edema, pulses intact, no cyanosis or clubbing.  GI:   Soft & nt; nml bowel sounds; no organomegaly or masses detected.   Musco: Warm bil, no deformities or joint swelling noted.    Neuro: alert, no focal deficits noted.    Skin: Warm, no lesions or rashes    Lab Results:  CBC    Component Value Date/Time   WBC 5.3 05/11/2017 1018   RBC 5.10 05/11/2017 1018   HGB 15.7 05/11/2017 1018   HCT 45.6 05/11/2017 1018   PLT 197 05/11/2017 1018   MCV 89 05/11/2017 1018   MCH 30.8 05/11/2017 1018   MCHC 34.4 05/11/2017 1018   RDW 13.8 05/11/2017 1018   LYMPHSABS 2.0 05/11/2017 1018   EOSABS 0.2 05/11/2017 1018   BASOSABS 0.0 05/11/2017 1018    BMET  BNP No results found for: BNP  ProBNP No results found for: PROBNP  Imaging: No results found.   Assessment & Plan:   Obstructive sleep apnea Well-controlled on CPAP  Plan  Patient Instructions  Continue on CPAP At bedtime  .  Keep up good work .  Work on healthy weight.  Do not drive if sleepy  Follow up with Dr. Elsworth Soho  In 1 year and As needed        Mild obesity Wt loss      Rexene Edison, NP 06/28/2017

## 2017-06-28 NOTE — Assessment & Plan Note (Signed)
Well-controlled on CPAP  Plan  Patient Instructions  Continue on CPAP At bedtime  .  Keep up good work .  Work on healthy weight.  Do not drive if sleepy  Follow up with Dr. Elsworth Soho  In 1 year and As needed

## 2017-10-07 ENCOUNTER — Ambulatory Visit (INDEPENDENT_AMBULATORY_CARE_PROVIDER_SITE_OTHER): Payer: Medicare Other

## 2017-10-07 DIAGNOSIS — Z23 Encounter for immunization: Secondary | ICD-10-CM

## 2017-11-11 ENCOUNTER — Ambulatory Visit: Payer: Medicare Other | Admitting: Family Medicine

## 2017-11-30 ENCOUNTER — Encounter: Payer: Self-pay | Admitting: Family Medicine

## 2017-11-30 ENCOUNTER — Ambulatory Visit (INDEPENDENT_AMBULATORY_CARE_PROVIDER_SITE_OTHER): Payer: Medicare Other | Admitting: Family Medicine

## 2017-11-30 VITALS — BP 120/76 | HR 65 | Temp 97.2°F | Ht 68.0 in | Wt 220.2 lb

## 2017-11-30 DIAGNOSIS — N401 Enlarged prostate with lower urinary tract symptoms: Secondary | ICD-10-CM

## 2017-11-30 DIAGNOSIS — N138 Other obstructive and reflux uropathy: Secondary | ICD-10-CM

## 2017-11-30 DIAGNOSIS — M109 Gout, unspecified: Secondary | ICD-10-CM | POA: Diagnosis not present

## 2017-11-30 DIAGNOSIS — E785 Hyperlipidemia, unspecified: Secondary | ICD-10-CM

## 2017-11-30 DIAGNOSIS — I10 Essential (primary) hypertension: Secondary | ICD-10-CM | POA: Diagnosis not present

## 2017-11-30 LAB — LP+ALT+AST PICCOLO, WAIVED
ALT (SGPT) Piccolo, Waived: 43 U/L (ref 10–47)
AST (SGOT) Piccolo, Waived: 38 U/L (ref 11–38)
CHOLESTEROL PICCOLO, WAIVED: 169 mg/dL (ref <200)
Chol/HDL Ratio Piccolo,Waive: 3.6 mg/dL
HDL CHOL PICCOLO, WAIVED: 47 mg/dL — AB (ref >59)
LDL CHOL CALC PICCOLO WAIVED: 93 mg/dL (ref <100)
Triglycerides Piccolo,Waived: 148 mg/dL (ref <150)
VLDL Chol Calc Piccolo,Waive: 30 mg/dL — ABNORMAL HIGH (ref <30)

## 2017-11-30 NOTE — Progress Notes (Signed)
BP 120/76   Pulse 65   Temp (!) 97.2 F (36.2 C) (Oral)   Ht 5\' 8"  (1.727 m)   Wt 220 lb 3.2 oz (99.9 kg)   SpO2 96%   BMI 33.48 kg/m    Subjective:    Patient ID: Eugene Scott, male    DOB: 06/29/1950, 67 y.o.   MRN: [id number]  HPI: Eugene Scott is a 67 y.o. male  Chief Complaint  Patient presents with  . Hyperlipidemia  . Hypertension  Patient all in all doing well blood pressure doing well no complaints from benazepril. Taking simvastatin for cholesterol without problems. Allopurinol for gout with no gout issues. Taking finasteride without problems for BPH and doing well. Also takes occasional Valtrex for fever blisters and occasional melatonin.  Relevant past medical, surgical, family and social history reviewed and updated as indicated. Interim medical history since our last visit reviewed. Allergies and medications reviewed and updated.  Review of Systems  Constitutional: Negative.   Respiratory: Negative.   Cardiovascular: Negative.     Per HPI unless specifically indicated above     Objective:    BP 120/76   Pulse 65   Temp (!) 97.2 F (36.2 C) (Oral)   Ht 5\' 8"  (1.727 m)   Wt 220 lb 3.2 oz (99.9 kg)   SpO2 96%   BMI 33.48 kg/m   Wt Readings from Last 3 Encounters:  11/30/17 220 lb 3.2 oz (99.9 kg)  06/28/17 217 lb 3.2 oz (98.5 kg)  05/11/17 218 lb (98.9 kg)    Physical Exam  Constitutional: He is oriented to person, place, and time. He appears well-developed and well-nourished.  HENT:  Head: Normocephalic and atraumatic.  Eyes: Conjunctivae and EOM are normal.  Neck: Normal range of motion.  Cardiovascular: Normal rate, regular rhythm and normal heart sounds.  Pulmonary/Chest: Effort normal and breath sounds normal.  Musculoskeletal: Normal range of motion.  Neurological: He is alert and oriented to person, place, and time.  Skin: No erythema.  Psychiatric: He has a normal mood and affect. His behavior is normal. Judgment  and thought content normal.    Results for orders placed or performed in visit on 05/11/17  Microscopic Examination  Result Value Ref Range   WBC, UA 0-5 0 - 5 /hpf   RBC, UA 0-2 0 - 2 /hpf   Epithelial Cells (non renal) CANCELED    Renal Epithel, UA 0-10 (A) None seen /hpf   Bacteria, UA None seen None seen/Few  CBC with Differential/Platelet  Result Value Ref Range   WBC 5.3 3.4 - 10.8 x10E3/uL   RBC 5.10 4.14 - 5.80 x10E6/uL   Hemoglobin 15.7 13.0 - 17.7 g/dL   Hematocrit 45.6 37.5 - 51.0 %   MCV 89 79 - 97 fL   MCH 30.8 26.6 - 33.0 pg   MCHC 34.4 31.5 - 35.7 g/dL   RDW 13.8 12.3 - 15.4 %   Platelets 197 150 - 379 x10E3/uL   Neutrophils 51 Not Estab. %   Lymphs 38 Not Estab. %   Monocytes 7 Not Estab. %   Eos 4 Not Estab. %   Basos 0 Not Estab. %   Neutrophils Absolute 2.7 1.4 - 7.0 x10E3/uL   Lymphocytes Absolute 2.0 0.7 - 3.1 x10E3/uL   Monocytes Absolute 0.4 0.1 - 0.9 x10E3/uL   EOS (ABSOLUTE) 0.2 0.0 - 0.4 x10E3/uL   Basophils Absolute 0.0 0.0 - 0.2 x10E3/uL   Immature Granulocytes 0 Not Estab. %  Immature Grans (Abs) 0.0 0.0 - 0.1 x10E3/uL  Comprehensive metabolic panel  Result Value Ref Range   Glucose 116 (H) 65 - 99 mg/dL   BUN 24 8 - 27 mg/dL   Creatinine, Ser 1.25 0.76 - 1.27 mg/dL   GFR calc non Af Amer 60 >59 mL/min/1.73   GFR calc Af Amer 69 >59 mL/min/1.73   BUN/Creatinine Ratio 19 10 - 24   Sodium 139 134 - 144 mmol/L   Potassium 4.6 3.5 - 5.2 mmol/L   Chloride 103 96 - 106 mmol/L   CO2 21 20 - 29 mmol/L   Calcium 9.3 8.6 - 10.2 mg/dL   Total Protein 7.0 6.0 - 8.5 g/dL   Albumin 4.6 3.6 - 4.8 g/dL   Globulin, Total 2.4 1.5 - 4.5 g/dL   Albumin/Globulin Ratio 1.9 1.2 - 2.2   Bilirubin Total 0.4 0.0 - 1.2 mg/dL   Alkaline Phosphatase 61 39 - 117 IU/L   AST 24 0 - 40 IU/L   ALT 30 0 - 44 IU/L  Lipid panel  Result Value Ref Range   Cholesterol, Total 172 100 - 199 mg/dL   Triglycerides 195 (H) 0 - 149 mg/dL   HDL 41 >39 mg/dL   VLDL  Cholesterol Cal 39 5 - 40 mg/dL   LDL Calculated 92 0 - 99 mg/dL   Chol/HDL Ratio 4.2 0.0 - 5.0 ratio  PSA  Result Value Ref Range   Prostate Specific Ag, Serum 0.2 0.0 - 4.0 ng/mL  TSH  Result Value Ref Range   TSH 2.850 0.450 - 4.500 uIU/mL  Urinalysis, Routine w reflex microscopic  Result Value Ref Range   Specific Gravity, UA 1.020 1.005 - 1.030   pH, UA 5.0 5.0 - 7.5   Color, UA Yellow Yellow   Appearance Ur Clear Clear   Leukocytes, UA Negative Negative   Protein, UA Negative Negative/Trace   Glucose, UA Negative Negative   Ketones, UA Negative Negative   RBC, UA 3+ (A) Negative   Bilirubin, UA Negative Negative   Urobilinogen, Ur 0.2 0.2 - 1.0 mg/dL   Nitrite, UA Negative Negative   Microscopic Examination See below:       Assessment & Plan:   Problem List Items Addressed This Visit      Cardiovascular and Mediastinum   Essential hypertension - Primary    The current medical regimen is effective;  continue present plan and medications.       Relevant Orders   Basic metabolic panel     Genitourinary   Benign prostatic hyperplasia with urinary obstruction    The current medical regimen is effective;  continue present plan and medications.         Other   Gout    The current medical regimen is effective;  continue present plan and medications.       Hyperlipidemia    The current medical regimen is effective;  continue present plan and medications.       Relevant Orders   LP+ALT+AST Piccolo, Waived       Follow up plan: Return in about 6 months (around 05/31/2018) for Physical Exam, uric acid.

## 2017-11-30 NOTE — Assessment & Plan Note (Signed)
The current medical regimen is effective;  continue present plan and medications.  

## 2017-12-01 ENCOUNTER — Encounter: Payer: Self-pay | Admitting: Family Medicine

## 2017-12-01 LAB — BASIC METABOLIC PANEL
BUN / CREAT RATIO: 18 (ref 10–24)
BUN: 21 mg/dL (ref 8–27)
CO2: 23 mmol/L (ref 20–29)
CREATININE: 1.2 mg/dL (ref 0.76–1.27)
Calcium: 9.6 mg/dL (ref 8.6–10.2)
Chloride: 100 mmol/L (ref 96–106)
GFR, EST AFRICAN AMERICAN: 72 mL/min/{1.73_m2} (ref >59)
GFR, EST NON AFRICAN AMERICAN: 63 mL/min/{1.73_m2} (ref >59)
Glucose: 108 mg/dL — ABNORMAL HIGH (ref 65–99)
Potassium: 4.6 mmol/L (ref 3.5–5.2)
SODIUM: 141 mmol/L (ref 134–144)

## 2018-02-09 ENCOUNTER — Other Ambulatory Visit: Payer: Self-pay | Admitting: Family Medicine

## 2018-02-09 NOTE — Telephone Encounter (Signed)
Filled until next appointment Requested Prescriptions  Pending Prescriptions Disp Refills  . benazepril (LOTENSIN) 40 MG tablet [Pharmacy Med Name: BENAZEPRIL HCL 40 MG TABLET] 90 tablet 1    Sig: TAKE 1 TABLET BY MOUTH EVERY DAY     Cardiovascular:  ACE Inhibitors Passed - 02/09/2018  2:34 PM      Passed - Cr in normal range and within 180 days    Creatinine, Ser  Date Value Ref Range Status  11/30/2017 1.20 0.76 - 1.27 mg/dL Final         Passed - K in normal range and within 180 days    Potassium  Date Value Ref Range Status  11/30/2017 4.6 3.5 - 5.2 mmol/L Final         Passed - Patient is not pregnant      Passed - Last BP in normal range    BP Readings from Last 1 Encounters:  11/30/17 120/76         Passed - Valid encounter within last 6 months    Recent Outpatient Visits          2 months ago Essential hypertension   [place], Jeannette How, MD   9 months ago Essential hypertension   [place], Jeannette How, MD   10 months ago Vasovagal syncope   [place], Jeannette How, MD   1 year ago Essential hypertension   [facility], Jeannette How, MD   1 year ago Annual physical exam   [place], [place]      Future Appointments            In 3 months  [place], [place]   In 4 months [place], Jeannette How, MD [facility]

## 2018-05-09 ENCOUNTER — Telehealth: Payer: Self-pay

## 2018-05-09 NOTE — Telephone Encounter (Signed)
Pt called back in, transferred call to direct number listed

## 2018-05-09 NOTE — Telephone Encounter (Signed)
Patient scheduled for an AWV on 05/15/2017 with [facility], Due to Covid-19 pandemic this is unable to be done in office, called patient to see if they are able to do this virtually/telephonically. Left message for patient to call back.  Direct call back [phone]

## 2018-05-16 ENCOUNTER — Other Ambulatory Visit: Payer: Self-pay | Admitting: Family Medicine

## 2018-05-16 ENCOUNTER — Other Ambulatory Visit: Payer: Self-pay

## 2018-05-16 ENCOUNTER — Ambulatory Visit (INDEPENDENT_AMBULATORY_CARE_PROVIDER_SITE_OTHER): Payer: Medicare Other

## 2018-05-16 DIAGNOSIS — Z0000 Encounter for general adult medical examination without abnormal findings: Secondary | ICD-10-CM | POA: Diagnosis not present

## 2018-05-16 DIAGNOSIS — M109 Gout, unspecified: Secondary | ICD-10-CM

## 2018-05-16 NOTE — Patient Instructions (Signed)
Eugene Scott , Thank you for taking time to come for your Medicare Wellness Visit. I appreciate your ongoing commitment to your health goals. Please review the following plan we discussed and let me know if I can assist you in the future.   Screening recommendations/referrals: Colonoscopy: completed 02/09/2014, due 02/10/2019 Recommended yearly ophthalmology/optometry visit for glaucoma screening and checkup Recommended yearly dental visit for hygiene and checkup  Vaccinations: Influenza vaccine: up to date  Pneumococcal vaccine: completed series Tdap vaccine: up to date Shingles vaccine: completed series  Advanced directives: Please bring a copy of your health care power of attorney and living will to the office at your convenience.  Conditions/risks identified: none   Next appointment: Follow up in one year for your annual wellness exam.   Preventive Care 65 Years and Older, Male Preventive care refers to lifestyle choices and visits with your health care provider that can promote health and wellness. What does preventive care include?  A yearly physical exam. This is also called an annual well check.  Dental exams once or twice a year.  Routine eye exams. Ask your health care provider how often you should have your eyes checked.  Personal lifestyle choices, including:  Daily care of your teeth and gums.  Regular physical activity.  Eating a healthy diet.  Avoiding tobacco and drug use.  Limiting alcohol use.  Practicing safe sex.  Taking low doses of aspirin every day.  Taking vitamin and mineral supplements as recommended by your health care provider. What happens during an annual well check? The services and screenings done by your health care provider during your annual well check will depend on your age, overall health, lifestyle risk factors, and family history of disease. Counseling  Your health care provider may ask you questions about your:  Alcohol use.   Tobacco use.  Drug use.  Emotional well-being.  Home and relationship well-being.  Sexual activity.  Eating habits.  History of falls.  Memory and ability to understand (cognition).  Work and work Statistician. Screening  You may have the following tests or measurements:  Height, weight, and BMI.  Blood pressure.  Lipid and cholesterol levels. These may be checked every 5 years, or more frequently if you are over 28 years old.  Skin check.  Lung cancer screening. You may have this screening every year starting at age 24 if you have a 30-pack-year history of smoking and currently smoke or have quit within the past 15 years.  Fecal occult blood test (FOBT) of the stool. You may have this test every year starting at age 49.  Flexible sigmoidoscopy or colonoscopy. You may have a sigmoidoscopy every 5 years or a colonoscopy every 10 years starting at age 23.  Prostate cancer screening. Recommendations will vary depending on your family history and other risks.  Hepatitis C blood test.  Hepatitis B blood test.  Sexually transmitted disease (STD) testing.  Diabetes screening. This is done by checking your blood sugar (glucose) after you have not eaten for a while (fasting). You may have this done every 1-3 years.  Abdominal aortic aneurysm (AAA) screening. You may need this if you are a current or former smoker.  Osteoporosis. You may be screened starting at age 63 if you are at high risk. Talk with your health care provider about your test results, treatment options, and if necessary, the need for more tests. Vaccines  Your health care provider may recommend certain vaccines, such as:  Influenza vaccine. This is recommended  every year.  Tetanus, diphtheria, and acellular pertussis (Tdap, Td) vaccine. You may need a Td booster every 10 years.  Zoster vaccine. You may need this after age 50.  Pneumococcal 13-valent conjugate (PCV13) vaccine. One dose is recommended  after age 17.  Pneumococcal polysaccharide (PPSV23) vaccine. One dose is recommended after age 67. Talk to your health care provider about which screenings and vaccines you need and how often you need them. This information is not intended to replace advice given to you by your health care provider. Make sure you discuss any questions you have with your health care provider. Document Released: 01/18/2015 Document Revised: 09/11/2015 Document Reviewed: 10/23/2014 Elsevier Interactive Patient Education  2017 [place] Prevention in the Home Falls can cause injuries. They can happen to people of all ages. There are many things you can do to make your home safe and to help prevent falls. What can I do on the outside of my home?  Regularly fix the edges of walkways and driveways and fix any cracks.  Remove anything that might make you trip as you walk through a door, such as a raised step or threshold.  Trim any bushes or trees on the path to your home.  Use bright outdoor lighting.  Clear any walking paths of anything that might make someone trip, such as rocks or tools.  Regularly check to see if handrails are loose or broken. Make sure that both sides of any steps have handrails.  Any raised decks and porches should have guardrails on the edges.  Have any leaves, snow, or ice cleared regularly.  Use sand or salt on walking paths during winter.  Clean up any spills in your garage right away. This includes oil or grease spills. What can I do in the bathroom?  Use night lights.  Install grab bars by the toilet and in the tub and shower. Do not use towel bars as grab bars.  Use non-skid mats or decals in the tub or shower.  If you need to sit down in the shower, use a plastic, non-slip stool.  Keep the floor dry. Clean up any water that spills on the floor as soon as it happens.  Remove soap buildup in the tub or shower regularly.  Attach bath mats securely with  double-sided non-slip rug tape.  Do not have throw rugs and other things on the floor that can make you trip. What can I do in the bedroom?  Use night lights.  Make sure that you have a light by your bed that is easy to reach.  Do not use any sheets or blankets that are too big for your bed. They should not hang down onto the floor.  Have a firm chair that has side arms. You can use this for support while you get dressed.  Do not have throw rugs and other things on the floor that can make you trip. What can I do in the kitchen?  Clean up any spills right away.  Avoid walking on wet floors.  Keep items that you use a lot in easy-to-reach places.  If you need to reach something above you, use a strong step stool that has a grab bar.  Keep electrical cords out of the way.  Do not use floor polish or wax that makes floors slippery. If you must use wax, use non-skid floor wax.  Do not have throw rugs and other things on the floor that can make you trip. What  can I do with my stairs?  Do not leave any items on the stairs.  Make sure that there are handrails on both sides of the stairs and use them. Fix handrails that are broken or loose. Make sure that handrails are as long as the stairways.  Check any carpeting to make sure that it is firmly attached to the stairs. Fix any carpet that is loose or worn.  Avoid having throw rugs at the top or bottom of the stairs. If you do have throw rugs, attach them to the floor with carpet tape.  Make sure that you have a light switch at the top of the stairs and the bottom of the stairs. If you do not have them, ask someone to add them for you. What else can I do to help prevent falls?  Wear shoes that:  Do not have high heels.  Have rubber bottoms.  Are comfortable and fit you well.  Are closed at the toe. Do not wear sandals.  If you use a stepladder:  Make sure that it is fully opened. Do not climb a closed stepladder.  Make  sure that both sides of the stepladder are locked into place.  Ask someone to hold it for you, if possible.  Clearly mark and make sure that you can see:  Any grab bars or handrails.  First and last steps.  Where the edge of each step is.  Use tools that help you move around (mobility aids) if they are needed. These include:  Canes.  Walkers.  Scooters.  Crutches.  Turn on the lights when you go into a dark area. Replace any light bulbs as soon as they burn out.  Set up your furniture so you have a clear path. Avoid moving your furniture around.  If any of your floors are uneven, fix them.  If there are any pets around you, be aware of where they are.  Review your medicines with your doctor. Some medicines can make you feel dizzy. This can increase your chance of falling. Ask your doctor what other things that you can do to help prevent falls. This information is not intended to replace advice given to you by your health care provider. Make sure you discuss any questions you have with your health care provider. Document Released: 10/18/2008 Document Revised: 05/30/2015 Document Reviewed: 01/26/2014 Elsevier Interactive Patient Education  2017 [employer].

## 2018-05-16 NOTE — Progress Notes (Signed)
Subjective:   Eugene Scott is a 68 y.o. male who presents for Medicare Annual/Subsequent preventive examination.  This visit is being conducted via phone call  - after an attmept to do on video chat - due to the COVID-19 pandemic. This patient has given me verbal consent via phone to conduct this visit, patient states they are participating from their home address. Some vital signs may be absent or patient reported.   Patient identification: identified by name, DOB, and current address.    Review of Systems:   Cardiac Risk Factors include: advanced age (>49men, >28 women);hypertension;dyslipidemia;male gender     Objective:    Vitals: There were no vitals taken for this visit.  There is no height or weight on file to calculate BMI.  Advanced Directives 05/16/2018 05/10/2017  Does Patient Have a Medical Advance Directive? Yes Yes  Type of Advance Directive Living will;Healthcare Power of [place];Living will  Copy of [place] in Chart? No - copy requested No - copy requested    Tobacco Social History   Tobacco Use  Smoking Status Never Smoker  Smokeless Tobacco Never Used     Counseling given: Not Answered   Clinical Intake:  Pre-visit preparation completed: Yes  Pain : No/denies pain     Nutritional Risks: None Diabetes: No  How often do you need to have someone help you when you read instructions, pamphlets, or other written materials from your doctor or pharmacy?: 1 - Never What is the last grade level you completed in school?: doctorate  Interpreter Needed?: No  Information entered by :: Coleman Kalas,LPN   Past Medical History:  Diagnosis Date  . Allergy   . Benign hematuria   . BPH (benign prostatic hypertrophy) with urinary obstruction   . Cancer ([place])    skin  . Gout   . Hyperlipidemia    Past Surgical History:  Procedure Laterality Date  . FRACTURE SURGERY Right    elbow  . STRABISMUS  SURGERY     x3   Family History  Problem Relation Age of Onset  . Cancer Father        prostate  . Heart disease Father   . Osteoporosis Mother    Social History   Socioeconomic History  . Marital status: Married    Spouse name: Not on file  . Number of children: Not on file  . Years of education: Not on file  . Highest education level: Doctorate  Occupational History  . Occupation: retired  Scientific laboratory technician  . Financial resource strain: Not hard at all  . Food insecurity:    Worry: Never true    Inability: Never true  . Transportation needs:    Medical: No    Non-medical: No  Tobacco Use  . Smoking status: Never Smoker  . Smokeless tobacco: Never Used  Substance and Sexual Activity  . Alcohol use: Yes    Comment: social  . Drug use: No  . Sexual activity: Not on file  Lifestyle  . Physical activity:    Days per week: 5 days    Minutes per session: 60 min  . Stress: Not at all  Relationships  . Social connections:    Talks on phone: More than three times a week    Gets together: More than three times a week    Attends religious service: More than 4 times per year    Active member of club or organization: Yes  Attends meetings of clubs or organizations: More than 4 times per year    Relationship status: Married  Other Topics Concern  . Not on file  Social History Narrative  . Not on file    Outpatient Encounter Medications as of 05/16/2018  Medication Sig  . allopurinol (ZYLOPRIM) 300 MG tablet TAKE 1 TABLET BY MOUTH EVERY DAY  . benazepril (LOTENSIN) 40 MG tablet TAKE 1 TABLET BY MOUTH EVERY DAY  . finasteride (PROSCAR) 5 MG tablet Take 1 tablet (5 mg total) by mouth daily.  . Melatonin 1 MG CAPS Take by mouth as needed.  . simvastatin (ZOCOR) 40 MG tablet Take 1 tablet (40 mg total) by mouth daily.  . valACYclovir (VALTREX) 1000 MG tablet Take 1 tablet (1,000 mg total) by mouth 2 (two) times daily as needed.   No facility-administered encounter  medications on file as of 05/16/2018.     Activities of Daily Living In your present state of health, do you have any difficulty performing the following activities: 05/16/2018  Hearing? N  Vision? N  Difficulty concentrating or making decisions? N  Walking or climbing stairs? N  Dressing or bathing? N  Doing errands, shopping? N  Preparing Food and eating ? N  Using the Toilet? N  In the past six months, have you accidently leaked urine? N  Do you have problems with loss of bowel control? N  Managing your Medications? N  Managing your Finances? N  Housekeeping or managing your Housekeeping? N  Some recent data might be hidden    Patient Care Team: Guadalupe Maple, MD as PCP - General (Family Medicine)   Assessment:   This is a routine wellness examination for Eugene Scott.  Exercise Activities and Dietary recommendations Current Exercise Habits: Home exercise routine, Type of exercise: walking;stretching, Time (Minutes): 60, Frequency (Times/Week): 4, Weekly Exercise (Minutes/Week): 240, Intensity: Mild, Exercise limited by: None identified  Goals    . DIET - INCREASE WATER INTAKE     Recommend drinking at least 6-8 glasses of water a day        Fall Risk: Fall Risk  05/16/2018 05/10/2017 03/30/2017 05/05/2016  Falls in the past year? 0 Yes Yes No  Number falls in past yr: - 1 1 -  Injury with Fall? - No No -    FALL RISK PREVENTION PERTAINING TO THE HOME:  Any stairs in or around the home? Yes  If so, are there any without handrails? No   Home free of loose throw rugs in walkways, pet beds, electrical cords, etc? Yes  Adequate lighting in your home to reduce risk of falls? Yes   ASSISTIVE DEVICES UTILIZED TO PREVENT FALLS:  Life alert? No  Use of a cane, walker or w/c? No  Grab bars in the bathroom? No  Shower chair or bench in shower? No  Elevated toilet seat or a handicapped toilet? No   TIMED UP AND GO:  Unable to perform   Depression Screen PHQ 2/9 Scores  05/16/2018 05/10/2017 05/05/2016 12/04/2014  PHQ - 2 Score 0 0 0 0    Cognitive Function     6CIT Screen 05/16/2018 05/10/2017  What Year? 0 points 0 points  What month? 0 points 0 points  What time? 0 points 0 points  Count back from 20 0 points 0 points  Months in reverse 0 points 0 points  Repeat phrase 0 points 2 points  Total Score 0 2    Immunization History  Administered Date(s) Administered  .  Influenza Whole 10/29/2011  . Influenza, High Dose Seasonal PF 10/20/2016, 10/07/2017  . Influenza,inj,Quad PF,6+ Mos 10/10/2012  . Influenza-Unspecified 10/05/2013, 10/13/2014, 10/22/2015  . Pneumococcal Conjugate-13 05/05/2016  . Pneumococcal Polysaccharide-23 05/11/2017  . Tdap 10/25/2012  . Zoster 04/02/2011  . Zoster Recombinat (Shingrix) 03/15/2017, 05/20/2017    Qualifies for Shingles Vaccine? Yes  shingrix completed.   Tdap: up to date   Flu Vaccine: up to date   Pneumococcal Vaccine: up to date    Screening Tests Health Maintenance  Topic Date Due  . INFLUENZA VACCINE  08/06/2018  . COLONOSCOPY  02/10/2019  . TETANUS/TDAP  10/26/2022  . Hepatitis C Screening  Completed  . PNA vac Low Risk Adult  Completed   Cancer Screenings:  Colorectal Screening: Completed 02/09/2014. Repeat every 5 years  Lung Cancer Screening: (Low Dose CT Chest recommended if Age 59-80 years, 30 pack-year currently smoking OR have quit w/in 15years.) does not qualify.    Additional Screening:  Hepatitis C Screening: does qualify; Completed 12/04/2014  Vision Screening: Recommended annual ophthalmology exams for early detection of glaucoma and other disorders of the eye. Is the patient up to date with their annual eye exam?  Yes  Who is the provider or what is the name of the office in which the pt attends annual eye exams? [facility]   Dental Screening: Recommended annual dental exams for proper oral hygiene  Community Resource Referral:  CRR required this visit?  No        Plan:   I have personally reviewed and addressed the Medicare Annual Wellness questionnaire and have noted the following in the patient's chart:  A. Medical and social history B. Use of alcohol, tobacco or illicit drugs  C. Current medications and supplements D. Functional ability and status E.  Nutritional status F.  Physical activity G. Advance directives H. List of other physicians I.  Hospitalizations, surgeries, and ER visits in previous 12 months J.  [place] such as hearing and vision if needed, cognitive and depression L. Referrals and appointments   In addition, I have reviewed and discussed with patient certain preventive protocols, quality metrics, and best practice recommendations. A written personalized care plan for preventive services as well as general preventive health recommendations were provided to patient via mail.    Signed,   Bevelyn Ngo, LPN  [id number] Nurse Health Advisor   Nurse Notes: none

## 2018-06-16 ENCOUNTER — Encounter: Payer: Medicare Other | Admitting: Family Medicine

## 2018-06-28 ENCOUNTER — Other Ambulatory Visit: Payer: Self-pay

## 2018-06-28 ENCOUNTER — Ambulatory Visit (INDEPENDENT_AMBULATORY_CARE_PROVIDER_SITE_OTHER): Payer: Medicare Other | Admitting: Nurse Practitioner

## 2018-06-28 ENCOUNTER — Encounter: Payer: Self-pay | Admitting: Nurse Practitioner

## 2018-06-28 VITALS — BP 126/75 | HR 77 | Temp 97.5°F | Ht 67.5 in | Wt 219.8 lb

## 2018-06-28 DIAGNOSIS — Z6833 Body mass index (BMI) 33.0-33.9, adult: Secondary | ICD-10-CM

## 2018-06-28 DIAGNOSIS — M109 Gout, unspecified: Secondary | ICD-10-CM

## 2018-06-28 DIAGNOSIS — N138 Other obstructive and reflux uropathy: Secondary | ICD-10-CM

## 2018-06-28 DIAGNOSIS — N401 Enlarged prostate with lower urinary tract symptoms: Secondary | ICD-10-CM

## 2018-06-28 DIAGNOSIS — E6609 Other obesity due to excess calories: Secondary | ICD-10-CM

## 2018-06-28 DIAGNOSIS — E785 Hyperlipidemia, unspecified: Secondary | ICD-10-CM

## 2018-06-28 DIAGNOSIS — I10 Essential (primary) hypertension: Secondary | ICD-10-CM | POA: Diagnosis not present

## 2018-06-28 DIAGNOSIS — Z0000 Encounter for general adult medical examination without abnormal findings: Secondary | ICD-10-CM

## 2018-06-28 MED ORDER — FINASTERIDE 5 MG PO TABS: 5.0000 mg | ORAL_TABLET | Freq: Every day | ORAL | 2 refills | Status: DC

## 2018-06-28 MED ORDER — BENAZEPRIL HCL 40 MG PO TABS: 40.0000 mg | ORAL_TABLET | Freq: Every day | ORAL | 2 refills | Status: DC

## 2018-06-28 MED ORDER — SIMVASTATIN 40 MG PO TABS: 40.0000 mg | ORAL_TABLET | Freq: Every day | ORAL | 2 refills | Status: DC

## 2018-06-28 MED ORDER — ALLOPURINOL 300 MG PO TABS: 300.0000 mg | ORAL_TABLET | Freq: Every day | ORAL | 2 refills | Status: DC

## 2018-06-28 MED ORDER — VALACYCLOVIR HCL 1 G PO TABS: 1000.0000 mg | ORAL_TABLET | Freq: Two times a day (BID) | ORAL | 6 refills | Status: AC | PRN

## 2018-06-28 NOTE — Assessment & Plan Note (Signed)
Recommend continued focus on health diet choices and regular physical activity (30 minutes 5 days a week). 

## 2018-06-28 NOTE — Progress Notes (Signed)
BP 126/75   Pulse 77   Temp (!) 97.5 F (36.4 C) (Oral)   Ht 5' 7.5" (1.715 m)   Wt 219 lb 12.8 oz (99.7 kg)   SpO2 98%   BMI 33.92 kg/m    Subjective:    Patient ID: Eugene Scott, male    DOB: [birth year], 68 y.o.   MRN: [id number]  HPI: Eugene Scott is a 68 y.o. male presenting on 06/28/2018 for comprehensive medical examination. Current medical complaints include:none  He currently lives with: wife Interim Problems from his last visit: no   HYPERTENSION / HYPERLIPIDEMIA Continues on Benazepril and Simvastatin. Satisfied with current treatment? yes Duration of hypertension: chronic BP monitoring frequency: not checking BP range:  BP medication side effects: no Duration of hyperlipidemia: chronic Cholesterol medication side effects: no Cholesterol supplements: none Medication compliance: good compliance Aspirin: no Recent stressors: no Recurrent headaches: no Visual changes: no Palpitations: no Dyspnea: no Chest pain: no Lower extremity edema: no Dizzy/lightheaded: no   BPH Continues Finasteride. BPH status: controlled Satisfied with current treatment?: no Medication side effects: no Medication compliance: good compliance Duration: chronic Nocturia: 1/night Urinary frequency:no Incomplete voiding: no Urgency: no Weak urinary stream: no Straining to start stream: no Dysuria: no Onset: gradual Severity: no symptoms  GOUT Currently well-controlled with Allopurinol, has been on this since the 1980's.  Last flare was in the mid-90's due to him not taking medication for a short period after moving here.    Functional Status Survey: Is the patient deaf or have difficulty hearing?: No Does the patient have difficulty seeing, even when wearing glasses/contacts?: No Does the patient have difficulty concentrating, remembering, or making decisions?: No Does the patient have difficulty walking or climbing stairs?: No Does the patient have  difficulty dressing or bathing?: No Does the patient have difficulty doing errands alone such as visiting a doctor's office or shopping?: No  FALL RISK: Fall Risk  06/28/2018 05/16/2018 05/10/2017 03/30/2017 05/05/2016  Falls in the past year? 0 0 Yes Yes No  Number falls in past yr: 0 - 1 1 -  Injury with Fall? 0 - No No -  Follow up Falls evaluation completed - - - -    Depression Screen Depression screen [facility] 2/9 06/28/2018 05/16/2018 05/10/2017 05/05/2016 12/04/2014  Decreased Interest 0 0 0 0 0  Down, Depressed, Hopeless 0 0 0 0 0  PHQ - 2 Score 0 0 0 0 0  Altered sleeping 0 - - - -  Tired, decreased energy 0 - - - -  Change in appetite 0 - - - -  Feeling bad or failure about yourself  0 - - - -  Trouble concentrating 0 - - - -  Moving slowly or fidgety/restless 0 - - - -  Suicidal thoughts 0 - - - -  PHQ-9 Score 0 - - - -  Difficult doing work/chores Not difficult at all - - - -    Advanced Directives <no information>  Past Medical History:  Past Medical History:  Diagnosis Date  . Allergy   . Benign hematuria   . BPH (benign prostatic hypertrophy) with urinary obstruction   . Cancer ([place])    skin  . Gout   . Hyperlipidemia     Surgical History:  Past Surgical History:  Procedure Laterality Date  . FRACTURE SURGERY Right    elbow  . STRABISMUS SURGERY     x3    Medications:  Current Outpatient Medications on File Prior to  Visit  Medication Sig  . Melatonin 1 MG CAPS Take by mouth as needed.   No current facility-administered medications on file prior to visit.     Allergies:  No Known Allergies  Social History:  Social History   Socioeconomic History  . Marital status: Married    Spouse name: Not on file  . Number of children: Not on file  . Years of education: Not on file  . Highest education level: Doctorate  Occupational History  . Occupation: retired  Scientific laboratory technician  . Financial resource strain: Not hard at all  . Food insecurity    Worry: Never  true    Inability: Never true  . Transportation needs    Medical: No    Non-medical: No  Tobacco Use  . Smoking status: Never Smoker  . Smokeless tobacco: Never Used  Substance and Sexual Activity  . Alcohol use: Yes    Comment: social  . Drug use: No  . Sexual activity: Not on file  Lifestyle  . Physical activity    Days per week: 5 days    Minutes per session: 60 min  . Stress: Not at all  Relationships  . Social connections    Talks on phone: More than three times a week    Gets together: More than three times a week    Attends religious service: More than 4 times per year    Active member of club or organization: Yes    Attends meetings of clubs or organizations: More than 4 times per year    Relationship status: Married  . Intimate partner violence    Fear of current or ex partner: No    Emotionally abused: No    Physically abused: No    Forced sexual activity: No  Other Topics Concern  . Not on file  Social History Narrative  . Not on file   Social History   Tobacco Use  Smoking Status Never Smoker  Smokeless Tobacco Never Used   Social History   Substance and Sexual Activity  Alcohol Use Yes   Comment: social    Family History:  Family History  Problem Relation Age of Onset  . Cancer Father        prostate  . Heart disease Father   . Osteoporosis Mother     Past medical history, surgical history, medications, allergies, family history and social history reviewed with patient today and changes made to appropriate areas of the chart.   Review of Systems - negative All other ROS negative except what is listed above and in the HPI.      Objective:    BP 126/75   Pulse 77   Temp (!) 97.5 F (36.4 C) (Oral)   Ht 5' 7.5" (1.715 m)   Wt 219 lb 12.8 oz (99.7 kg)   SpO2 98%   BMI 33.92 kg/m   Wt Readings from Last 3 Encounters:  06/28/18 219 lb 12.8 oz (99.7 kg)  11/30/17 220 lb 3.2 oz (99.9 kg)  06/28/17 217 lb 3.2 oz (98.5 kg)    Physical  Exam Vitals signs and nursing note reviewed.  Constitutional:      General: He is awake. He is not in acute distress.    Appearance: He is well-developed. He is obese. He is not ill-appearing.  HENT:     Head: Normocephalic and atraumatic.     Right Ear: Hearing, tympanic membrane, ear canal and external ear normal. No drainage.  Left Ear: Hearing, tympanic membrane, ear canal and external ear normal. No drainage.     Mouth/Throat:     Pharynx: Uvula midline.  Eyes:     General: Lids are normal.        Right eye: No discharge.        Left eye: No discharge.     Extraocular Movements: Extraocular movements intact.     Conjunctiva/sclera: Conjunctivae normal.     Pupils: Pupils are equal, round, and reactive to light.     Visual Fields: Right eye visual fields normal and left eye visual fields normal.  Neck:     Musculoskeletal: Normal range of motion and neck supple.     Thyroid: No thyromegaly.     Vascular: No carotid bruit or JVD.     Trachea: Trachea normal.  Cardiovascular:     Rate and Rhythm: Normal rate and regular rhythm.     Heart sounds: Normal heart sounds, S1 normal and S2 normal. No murmur. No gallop.   Pulmonary:     Effort: Pulmonary effort is normal.     Breath sounds: Normal breath sounds.  Abdominal:     General: Bowel sounds are normal.     Palpations: Abdomen is soft. There is no hepatomegaly or splenomegaly.  Musculoskeletal: Normal range of motion.     Right lower leg: No edema.     Left lower leg: No edema.  Skin:    General: Skin is warm and dry.     Capillary Refill: Capillary refill takes less than 2 seconds.     Findings: No rash.  Neurological:     Mental Status: He is alert and oriented to person, place, and time.     Cranial Nerves: Cranial nerves are intact.     Coordination: Coordination is intact.     Gait: Gait is intact.     Deep Tendon Reflexes: Reflexes are normal and symmetric.  Psychiatric:        Attention and Perception:  Attention normal.        Mood and Affect: Mood normal.        Speech: Speech normal.        Behavior: Behavior normal. Behavior is cooperative.        Thought Content: Thought content normal.        Judgment: Judgment normal.       Results for orders placed or performed in visit on 09/73/53  Basic metabolic panel  Result Value Ref Range   Glucose 108 (H) 65 - 99 mg/dL   BUN 21 8 - 27 mg/dL   Creatinine, Ser 1.20 0.76 - 1.27 mg/dL   GFR calc non Af Amer 63 >59 mL/min/1.73   GFR calc Af Amer 72 >59 mL/min/1.73   BUN/Creatinine Ratio 18 10 - 24   Sodium 141 134 - 144 mmol/L   Potassium 4.6 3.5 - 5.2 mmol/L   Chloride 100 96 - 106 mmol/L   CO2 23 20 - 29 mmol/L   Calcium 9.6 8.6 - 10.2 mg/dL  LP+ALT+AST Piccolo, Waived  Result Value Ref Range   ALT (SGPT) Piccolo, Waived 43 10 - 47 U/L   AST (SGOT) Piccolo, Waived 38 11 - 38 U/L   Cholesterol Piccolo, Waived 169 <200 mg/dL   HDL Chol Piccolo, Waived 47 (L) >59 mg/dL   Triglycerides Piccolo,Waived 148 <150 mg/dL   Chol/HDL Ratio Piccolo,Waive 3.6 mg/dL   LDL Chol Calc Piccolo Waived 93 <100 mg/dL   VLDL Chol Calc Piccolo,Waive 30 (  H) <30 mg/dL      Assessment & Plan:   Problem List Items Addressed This Visit      Cardiovascular and Mediastinum   Essential hypertension    Chronic, stable with BP below goal.  Recommend checking BP 3 times a week at home and documenting.  Continue current medication regimen.        Relevant Medications   simvastatin (ZOCOR) 40 MG tablet   benazepril (LOTENSIN) 40 MG tablet     Genitourinary   Benign prostatic hyperplasia with urinary obstruction    Chronic, stable with no symptoms.  Continue current medication regimen.        Relevant Medications   finasteride (PROSCAR) 5 MG tablet   Other Relevant Orders   PSA     Other   Gout    Chronic, stable.  Continue current medication regimen.  Labs today.      Relevant Medications   allopurinol (ZYLOPRIM) 300 MG tablet   Other  Relevant Orders   Uric acid   Hyperlipidemia    Chronic, ongoing.  Continue current medication regimen.  Labs today.      Relevant Medications   simvastatin (ZOCOR) 40 MG tablet   benazepril (LOTENSIN) 40 MG tablet   Other Relevant Orders   Comprehensive metabolic panel   Lipid Panel w/o Chol/HDL Ratio   Obesity    Recommend continued focus on health diet choices and regular physical activity (30 minutes 5 days a week).       Other Visit Diagnoses    Encounter for annual physical exam    -  Primary   Relevant Orders   CBC with Differential/Platelet   PSA   TSH      Discussed aspirin prophylaxis for myocardial infarction prevention and decision was it was not indicated  LABORATORY TESTING:  Health maintenance labs ordered today as discussed above.   The natural history of prostate cancer and ongoing controversy regarding screening and potential treatment outcomes of prostate cancer has been discussed with the patient. The meaning of a false positive PSA and a false negative PSA has been discussed. He indicates understanding of the limitations of this screening test and wishes to proceed with screening PSA testing.   IMMUNIZATIONS:   - Tdap: Tetanus vaccination status reviewed: last tetanus booster within 10 years. - Influenza: Up to date - Pneumovax: Up to date - Prevnar: Up to date - Zostavax vaccine: Up to date  SCREENING: - Colonoscopy: Up to date  Discussed with patient purpose of the colonoscopy is to detect colon cancer at curable precancerous or early stages   - AAA Screening: Not applicable  -Hearing Test: Not applicable  -Spirometry: Not applicable   PATIENT COUNSELING:    Sexuality: Discussed sexually transmitted diseases, partner selection, use of condoms, avoidance of unintended pregnancy  and contraceptive alternatives.   Advised to avoid cigarette smoking.  I discussed with the patient that most people either abstain from alcohol or drink within  safe limits (<=14/week and <=4 drinks/occasion for males, <=7/weeks and <= 3 drinks/occasion for females) and that the risk for alcohol disorders and other health effects rises proportionally with the number of drinks per week and how often a drinker exceeds daily limits.  Discussed cessation/primary prevention of drug use and availability of treatment for abuse.   Diet: Encouraged to adjust caloric intake to maintain  or achieve ideal body weight, to reduce intake of dietary saturated fat and total fat, to limit sodium intake by avoiding high sodium foods  and not adding table salt, and to maintain adequate dietary potassium and calcium preferably from fresh fruits, vegetables, and low-fat dairy products.    stressed the importance of regular exercise  Injury prevention: Discussed safety belts, safety helmets, smoke detector, smoking near bedding or upholstery.   Dental health: Discussed importance of regular tooth brushing, flossing, and dental visits.   Follow up plan: NEXT PREVENTATIVE PHYSICAL DUE IN 1 YEAR. Return in about 6 months (around 12/28/2018) for HTN/HLD, BPH.

## 2018-06-28 NOTE — Assessment & Plan Note (Signed)
Chronic, stable with BP below goal.  Recommend checking BP 3 times a week at home and documenting.  Continue current medication regimen.

## 2018-06-28 NOTE — Patient Instructions (Signed)
DASH Eating Plan  DASH stands for "Dietary Approaches to Stop Hypertension." The DASH eating plan is a healthy eating plan that has been shown to reduce high blood pressure (hypertension). It may also reduce your risk for type 2 diabetes, heart disease, and stroke. The DASH eating plan may also help with weight loss.  What are tips for following this plan?    General guidelines   Avoid eating more than 2,300 mg (milligrams) of salt (sodium) a day. If you have hypertension, you may need to reduce your sodium intake to 1,500 mg a day.   Limit alcohol intake to no more than 1 drink a day for nonpregnant women and 2 drinks a day for men. One drink equals 12 oz of beer, 5 oz of wine, or 1 oz of hard liquor.   Work with your health care provider to maintain a healthy body weight or to lose weight. Ask what an ideal weight is for you.   Get at least 30 minutes of exercise that causes your heart to beat faster (aerobic exercise) most days of the week. Activities may include walking, swimming, or biking.   Work with your health care provider or diet and nutrition specialist (dietitian) to adjust your eating plan to your individual calorie needs.  Reading food labels     Check food labels for the amount of sodium per serving. Choose foods with less than 5 percent of the Daily Value of sodium. Generally, foods with less than 300 mg of sodium per serving fit into this eating plan.   To find whole grains, look for the word "whole" as the first word in the ingredient list.  Shopping   Buy products labeled as "low-sodium" or "no salt added."   Buy fresh foods. Avoid canned foods and premade or frozen meals.  Cooking   Avoid adding salt when cooking. Use salt-free seasonings or herbs instead of table salt or sea salt. Check with your health care provider or pharmacist before using salt substitutes.   Do not fry foods. Cook foods using healthy methods such as baking, boiling, grilling, and broiling instead.   Cook with  heart-healthy oils, such as olive, canola, soybean, or sunflower oil.  Meal planning   Eat a balanced diet that includes:  ? 5 or more servings of fruits and vegetables each day. At each meal, try to fill half of your plate with fruits and vegetables.  ? Up to 6-8 servings of whole grains each day.  ? Less than 6 oz of lean meat, poultry, or fish each day. A 3-oz serving of meat is about the same size as a deck of cards. One egg equals 1 oz.  ? 2 servings of low-fat dairy each day.  ? A serving of nuts, seeds, or beans 5 times each week.  ? Heart-healthy fats. Healthy fats called Omega-3 fatty acids are found in foods such as flaxseeds and coldwater fish, like sardines, salmon, and mackerel.   Limit how much you eat of the following:  ? Canned or prepackaged foods.  ? Food that is high in trans fat, such as fried foods.  ? Food that is high in saturated fat, such as fatty meat.  ? Sweets, desserts, sugary drinks, and other foods with added sugar.  ? Full-fat dairy products.   Do not salt foods before eating.   Try to eat at least 2 vegetarian meals each week.   Eat more home-cooked food and less restaurant, buffet, and fast food.     When eating at a restaurant, ask that your food be prepared with less salt or no salt, if possible.  What foods are recommended?  The items listed may not be a complete list. Talk with your dietitian about what dietary choices are best for you.  Grains  Whole-grain or whole-wheat bread. Whole-grain or whole-wheat pasta. Brown rice. Oatmeal. Quinoa. Bulgur. Whole-grain and low-sodium cereals. Pita bread. Low-fat, low-sodium crackers. Whole-wheat flour tortillas.  Vegetables  Fresh or frozen vegetables (raw, steamed, roasted, or grilled). Low-sodium or reduced-sodium tomato and vegetable juice. Low-sodium or reduced-sodium tomato sauce and tomato paste. Low-sodium or reduced-sodium canned vegetables.  Fruits  All fresh, dried, or frozen fruit. Canned fruit in natural juice (without  added sugar).  Meat and other protein foods  Skinless chicken or turkey. Ground chicken or turkey. Pork with fat trimmed off. Fish and seafood. Egg whites. Dried beans, peas, or lentils. Unsalted nuts, nut butters, and seeds. Unsalted canned beans. Lean cuts of beef with fat trimmed off. Low-sodium, lean deli meat.  Dairy  Low-fat (1%) or fat-free (skim) milk. Fat-free, low-fat, or reduced-fat cheeses. Nonfat, low-sodium ricotta or cottage cheese. Low-fat or nonfat yogurt. Low-fat, low-sodium cheese.  Fats and oils  Soft margarine without trans fats. Vegetable oil. Low-fat, reduced-fat, or light mayonnaise and salad dressings (reduced-sodium). Canola, safflower, olive, soybean, and sunflower oils. Avocado.  Seasoning and other foods  Herbs. Spices. Seasoning mixes without salt. Unsalted popcorn and pretzels. Fat-free sweets.  What foods are not recommended?  The items listed may not be a complete list. Talk with your dietitian about what dietary choices are best for you.  Grains  Baked goods made with fat, such as croissants, muffins, or some breads. Dry pasta or rice meal packs.  Vegetables  Creamed or fried vegetables. Vegetables in a cheese sauce. Regular canned vegetables (not low-sodium or reduced-sodium). Regular canned tomato sauce and paste (not low-sodium or reduced-sodium). Regular tomato and vegetable juice (not low-sodium or reduced-sodium). Pickles. Olives.  Fruits  Canned fruit in a light or heavy syrup. Fried fruit. Fruit in cream or butter sauce.  Meat and other protein foods  Fatty cuts of meat. Ribs. Fried meat. Bacon. Sausage. Bologna and other processed lunch meats. Salami. Fatback. Hotdogs. Bratwurst. Salted nuts and seeds. Canned beans with added salt. Canned or smoked fish. Whole eggs or egg yolks. Chicken or turkey with skin.  Dairy  Whole or 2% milk, cream, and half-and-half. Whole or full-fat cream cheese. Whole-fat or sweetened yogurt. Full-fat cheese. Nondairy creamers. Whipped toppings.  Processed cheese and cheese spreads.  Fats and oils  Butter. Stick margarine. Lard. Shortening. Ghee. Bacon fat. Tropical oils, such as coconut, palm kernel, or palm oil.  Seasoning and other foods  Salted popcorn and pretzels. Onion salt, garlic salt, seasoned salt, table salt, and sea salt. Worcestershire sauce. Tartar sauce. Barbecue sauce. Teriyaki sauce. Soy sauce, including reduced-sodium. Steak sauce. Canned and packaged gravies. Fish sauce. Oyster sauce. Cocktail sauce. Horseradish that you find on the shelf. Ketchup. Mustard. Meat flavorings and tenderizers. Bouillon cubes. Hot sauce and Tabasco sauce. Premade or packaged marinades. Premade or packaged taco seasonings. Relishes. Regular salad dressings.  Where to find more information:   National Heart, Lung, and Blood Institute: www.nhlbi.nih.gov   American Heart Association: www.heart.org  Summary   The DASH eating plan is a healthy eating plan that has been shown to reduce high blood pressure (hypertension). It may also reduce your risk for type 2 diabetes, heart disease, and stroke.   With the   DASH eating plan, you should limit salt (sodium) intake to 2,300 mg a day. If you have hypertension, you may need to reduce your sodium intake to 1,500 mg a day.   When on the DASH eating plan, aim to eat more fresh fruits and vegetables, whole grains, lean proteins, low-fat dairy, and heart-healthy fats.   Work with your health care provider or diet and nutrition specialist (dietitian) to adjust your eating plan to your individual calorie needs.  This information is not intended to replace advice given to you by your health care provider. Make sure you discuss any questions you have with your health care provider.  Document Released: 12/11/2010 Document Revised: 12/16/2015 Document Reviewed: 12/16/2015  Elsevier Interactive Patient Education  2019 [employer]

## 2018-06-28 NOTE — Assessment & Plan Note (Signed)
Chronic, stable.  Continue current medication regimen.  Labs today. 

## 2018-06-28 NOTE — Assessment & Plan Note (Signed)
Chronic, ongoing.  Continue current medication regimen.  Labs today.

## 2018-06-28 NOTE — Assessment & Plan Note (Signed)
Chronic, stable with no symptoms.  Continue current medication regimen.

## 2018-06-29 LAB — CBC WITH DIFFERENTIAL/PLATELET
Basophils Absolute: 0.1 10*3/uL (ref 0.0–0.2)
Basos: 1 %
EOS (ABSOLUTE): 0.1 10*3/uL (ref 0.0–0.4)
Eos: 2 %
Hematocrit: 46.8 % (ref 37.5–51.0)
Hemoglobin: 15.7 g/dL (ref 13.0–17.7)
Immature Grans (Abs): 0 10*3/uL (ref 0.0–0.1)
Immature Granulocytes: 0 %
Lymphocytes Absolute: 2.4 10*3/uL (ref 0.7–3.1)
Lymphs: 30 %
MCH: 29.9 pg (ref 26.6–33.0)
MCHC: 33.5 g/dL (ref 31.5–35.7)
MCV: 89 fL (ref 79–97)
Monocytes Absolute: 0.6 10*3/uL (ref 0.1–0.9)
Monocytes: 8 %
Neutrophils Absolute: 4.7 10*3/uL (ref 1.4–7.0)
Neutrophils: 59 %
Platelets: 209 10*3/uL (ref 150–450)
RBC: 5.25 x10E6/uL (ref 4.14–5.80)
RDW: 12.5 % (ref 11.6–15.4)
WBC: 7.8 10*3/uL (ref 3.4–10.8)

## 2018-06-29 LAB — COMPREHENSIVE METABOLIC PANEL
ALT: 35 IU/L (ref 0–44)
AST: 26 IU/L (ref 0–40)
Albumin/Globulin Ratio: 2 (ref 1.2–2.2)
Albumin: 4.7 g/dL (ref 3.8–4.8)
Alkaline Phosphatase: 62 IU/L (ref 39–117)
BUN/Creatinine Ratio: 19 (ref 10–24)
BUN: 23 mg/dL (ref 8–27)
Bilirubin Total: 0.4 mg/dL (ref 0.0–1.2)
CO2: 23 mmol/L (ref 20–29)
Calcium: 9.7 mg/dL (ref 8.6–10.2)
Chloride: 98 mmol/L (ref 96–106)
Creatinine, Ser: 1.19 mg/dL (ref 0.76–1.27)
GFR calc Af Amer: 73 mL/min/{1.73_m2} (ref >59)
GFR calc non Af Amer: 63 mL/min/{1.73_m2} (ref >59)
Globulin, Total: 2.4 g/dL (ref 1.5–4.5)
Glucose: 87 mg/dL (ref 65–99)
Potassium: 5 mmol/L (ref 3.5–5.2)
Sodium: 137 mmol/L (ref 134–144)
Total Protein: 7.1 g/dL (ref 6.0–8.5)

## 2018-06-29 LAB — LIPID PANEL W/O CHOL/HDL RATIO
Cholesterol, Total: 159 mg/dL (ref 100–199)
HDL: 44 mg/dL (ref >39)
LDL Calculated: 79 mg/dL (ref 0–99)
Triglycerides: 182 mg/dL — ABNORMAL HIGH (ref 0–149)
VLDL Cholesterol Cal: 36 mg/dL (ref 5–40)

## 2018-06-29 LAB — PSA: Prostate Specific Ag, Serum: 0.3 ng/mL (ref 0.0–4.0)

## 2018-06-29 LAB — TSH: TSH: 1.96 u[IU]/mL (ref 0.450–4.500)

## 2018-06-29 LAB — URIC ACID: Uric Acid: 4.8 mg/dL (ref 3.7–8.6)

## 2018-06-29 NOTE — Progress Notes (Signed)
Normal test results noted.  Please call patient and make them aware of normal results and will continue to monitor at regular visits.  Have a great day.  Look forward to seeing you at your next visit.

## 2018-10-11 ENCOUNTER — Ambulatory Visit (INDEPENDENT_AMBULATORY_CARE_PROVIDER_SITE_OTHER): Payer: Medicare Other

## 2018-10-11 ENCOUNTER — Other Ambulatory Visit: Payer: Self-pay

## 2018-10-11 DIAGNOSIS — Z23 Encounter for immunization: Secondary | ICD-10-CM

## 2018-10-27 ENCOUNTER — Other Ambulatory Visit: Payer: Self-pay

## 2018-10-27 ENCOUNTER — Ambulatory Visit: Payer: Medicare Other | Admitting: Pulmonary Disease

## 2018-10-27 ENCOUNTER — Encounter: Payer: Self-pay | Admitting: Pulmonary Disease

## 2018-10-27 DIAGNOSIS — G4733 Obstructive sleep apnea (adult) (pediatric): Secondary | ICD-10-CM | POA: Diagnosis not present

## 2018-10-27 DIAGNOSIS — I10 Essential (primary) hypertension: Secondary | ICD-10-CM

## 2018-10-27 NOTE — Patient Instructions (Signed)
CPAP is working well on current auto settings average pressure 16 cm CPAP supplies will be renewed as needed

## 2018-10-27 NOTE — Assessment & Plan Note (Signed)
Well-controlled on benazepril

## 2018-10-27 NOTE — Progress Notes (Signed)
   Subjective:    Patient ID: Eugene Scott, male    DOB: Oct 31, 1950, 68 y.o.   MRN: [id number]  HPI  68 yo man for follow-up of OSA  He has been maintained on CPAP since 2001 and current new machine in 2016  His prior auto CPAP settings showed average pressure of 13 cm.  On his last visit in 2018  he was changed to auto settings 10 to 18 cm He likes these new settings since pressure starts effluent dose up as needed. Download was reviewed which shows excellent control of events on auto settings with average pressure of 16 cm and no leak.  His compliance is great. He has no problems with mask or pressure and has not missed a single night  He used to take melatonin at bedtime and has stopped taking this in the past few months. He is maintained on benazepril for hypertension and this is well controlled, denies cough  Significant tests/ events reviewed  NPSG 2001: AHI 43/hr autotitration to 11cm 2010  Review of Systems Patient denies significant dyspnea,cough, hemoptysis,  chest pain, palpitations, pedal edema, orthopnea, paroxysmal nocturnal dyspnea, lightheadedness, nausea, vomiting, abdominal or  leg pains      Objective:   Physical Exam  Gen. Pleasant, obese, in no distress ENT - no lesions, no post nasal drip Neck: No JVD, no thyromegaly, no carotid bruits Lungs: no use of accessory muscles, no dullness to percussion, decreased without rales or rhonchi  Cardiovascular: Rhythm regular, heart sounds  normal, no murmurs or gallops, no peripheral edema Musculoskeletal: No deformities, no cyanosis or clubbing , no tremors       Assessment & Plan:

## 2018-10-27 NOTE — Assessment & Plan Note (Signed)
CPAP is working well on current auto settings average pressure 16 cm CPAP supplies will be renewed as needed  Weight loss encouraged, compliance with goal of at least 4-6 hrs every night is the expectation. Advised against medications with sedative side effects Cautioned against driving when sleepy - understanding that sleepiness will vary on a day to day basis

## 2019-01-18 ENCOUNTER — Ambulatory Visit: Payer: Medicare Other | Admitting: Family Medicine

## 2019-01-25 ENCOUNTER — Other Ambulatory Visit: Payer: Self-pay

## 2019-01-25 ENCOUNTER — Ambulatory Visit: Payer: Medicare PPO | Attending: Internal Medicine

## 2019-01-25 DIAGNOSIS — Z23 Encounter for immunization: Secondary | ICD-10-CM | POA: Insufficient documentation

## 2019-01-25 NOTE — Progress Notes (Signed)
   Covid-19 Vaccination Clinic  Name:  Eugene Scott    MRN: [id number] DOB: [birth year]  01/25/2019  [name] was observed post Covid-19 immunization for 15 minutes without incidence. He was provided with Vaccine Information Sheet and instruction to access the V-Safe system.   [name] was instructed to call 911 with any severe reactions post vaccine: Marland Kitchen Difficulty breathing  . Swelling of your face and throat  . A fast heartbeat  . A bad rash all over your body  . Dizziness and weakness    Immunizations Administered    Name Date Dose VIS Date Route   Pfizer COVID-19 Vaccine 01/25/2019  5:25 PM 0.3 mL 12/16/2018 Intramuscular   Manufacturer: [employer]   Lot: [id number]   [place]: [id number]

## 2019-02-01 ENCOUNTER — Ambulatory Visit: Payer: Medicare PPO

## 2019-02-12 ENCOUNTER — Ambulatory Visit: Payer: Medicare PPO

## 2019-02-15 ENCOUNTER — Ambulatory Visit: Payer: Medicare PPO | Attending: Internal Medicine

## 2019-02-15 ENCOUNTER — Ambulatory Visit: Payer: Medicare PPO

## 2019-02-15 DIAGNOSIS — Z23 Encounter for immunization: Secondary | ICD-10-CM | POA: Insufficient documentation

## 2019-02-15 NOTE — Progress Notes (Signed)
   Covid-19 Vaccination Clinic  Name:  Eugene Scott    MRN: [id number] DOB: [birth year]  02/15/2019  Eugene Scott was observed post Covid-19 immunization for 15 minutes without incidence. He was provided with Vaccine Information Sheet and instruction to access the V-Safe system.   Eugene Scott was instructed to call 911 with any severe reactions post vaccine: Marland Kitchen Difficulty breathing  . Swelling of your face and throat  . A fast heartbeat  . A bad rash all over your body  . Dizziness and weakness    Immunizations Administered    Name Date Dose VIS Date Route   Pfizer COVID-19 Vaccine 02/15/2019  9:42 AM 0.3 mL 12/16/2018 Intramuscular   Manufacturer: [place]   Lot: [id number]   [place]: [id number]

## 2019-02-19 ENCOUNTER — Other Ambulatory Visit: Payer: Self-pay | Admitting: Nurse Practitioner

## 2019-02-19 DIAGNOSIS — E785 Hyperlipidemia, unspecified: Secondary | ICD-10-CM

## 2019-02-19 NOTE — Telephone Encounter (Signed)
Requested Prescriptions  Pending Prescriptions Disp Refills  . simvastatin (ZOCOR) 40 MG tablet [Pharmacy Med Name: SIMVASTATIN 40 MG TABLET] 90 tablet 1    Sig: TAKE 1 TABLET BY MOUTH EVERY DAY     Cardiovascular:  Antilipid - Statins Failed - 02/19/2019  8:22 AM      Failed - Triglycerides in normal range and within 360 days    Triglycerides  Date Value Ref Range Status  06/28/2018 182 (H) 0 - 149 mg/dL Final   Triglycerides Piccolo,Waived  Date Value Ref Range Status  11/30/2017 148 <150 mg/dL Final    Comment:                            Normal                   <150                         Borderline High     150 - 199                         High                200 - 499                         Very High                >499          Passed - Total Cholesterol in normal range and within 360 days    Cholesterol, Total  Date Value Ref Range Status  06/28/2018 159 100 - 199 mg/dL Final   Cholesterol Piccolo, Waived  Date Value Ref Range Status  11/30/2017 169 <200 mg/dL Final    Comment:                            Desirable                <200                         Borderline High      200- 239                         High                     >239          Passed - LDL in normal range and within 360 days    LDL Calculated  Date Value Ref Range Status  06/28/2018 79 0 - 99 mg/dL Final         Passed - HDL in normal range and within 360 days    HDL  Date Value Ref Range Status  06/28/2018 44 >39 mg/dL Final         Passed - Patient is not pregnant      Passed - Valid encounter within last 12 months    Recent Outpatient Visits          7 months ago Encounter for annual physical exam   [place], Henrine Screws T, NP   1 year ago Essential hypertension   [facility], Jeannette How, MD  1 year ago Essential hypertension   [place], Jeannette How, MD   1 year ago Vasovagal syncope   [place], Jeannette How, MD   2 years ago Essential hypertension   [facility] Cr[place], Jeannette How, MD

## 2019-05-19 ENCOUNTER — Other Ambulatory Visit: Payer: Self-pay | Admitting: Nurse Practitioner

## 2019-05-19 NOTE — Telephone Encounter (Signed)
Called pt to let him know of Jolene's message, no answer, left vm

## 2019-05-19 NOTE — Telephone Encounter (Signed)
Requested medications are due for refill today?  Yes   Requested medications are on active medication list?  Yes  Last Refill:   06/28/2018  # 90 with 2 refills   Future visit scheduled?  No   Notes to Clinic:  Medication failed RX refill protocol due to no valid encounter in the past 6 months and labs not within established time frames.  Last office visit was 10 months ago.

## 2019-05-19 NOTE — Telephone Encounter (Signed)
LOV: 06/28/2018 with Jolene Cannady.  Last filled 06/28/2018 for 90 days with 2 refills.

## 2019-05-23 NOTE — Telephone Encounter (Signed)
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

## 2019-05-31 ENCOUNTER — Emergency Department: Payer: Medicare PPO

## 2019-05-31 ENCOUNTER — Other Ambulatory Visit: Payer: Self-pay

## 2019-05-31 ENCOUNTER — Emergency Department
Admission: EM | Admit: 2019-05-31 | Discharge: 2019-05-31 | Disposition: A | Discharge status: Home / Self Care | Payer: Medicare PPO | Attending: Emergency Medicine | Admitting: Emergency Medicine

## 2019-05-31 ENCOUNTER — Encounter: Payer: Self-pay | Admitting: Emergency Medicine

## 2019-05-31 DIAGNOSIS — Y9389 Activity, other specified: Secondary | ICD-10-CM | POA: Diagnosis not present

## 2019-05-31 DIAGNOSIS — W1839XA Other fall on same level, initial encounter: Secondary | ICD-10-CM | POA: Insufficient documentation

## 2019-05-31 DIAGNOSIS — Y999 Unspecified external cause status: Secondary | ICD-10-CM | POA: Diagnosis not present

## 2019-05-31 DIAGNOSIS — Z79899 Other long term (current) drug therapy: Secondary | ICD-10-CM | POA: Insufficient documentation

## 2019-05-31 DIAGNOSIS — S20212A Contusion of left front wall of thorax, initial encounter: Secondary | ICD-10-CM | POA: Diagnosis not present

## 2019-05-31 DIAGNOSIS — E860 Dehydration: Secondary | ICD-10-CM | POA: Insufficient documentation

## 2019-05-31 DIAGNOSIS — Y92009 Unspecified place in unspecified non-institutional (private) residence as the place of occurrence of the external cause: Secondary | ICD-10-CM | POA: Diagnosis not present

## 2019-05-31 DIAGNOSIS — I10 Essential (primary) hypertension: Secondary | ICD-10-CM | POA: Diagnosis not present

## 2019-05-31 DIAGNOSIS — R42 Dizziness and giddiness: Secondary | ICD-10-CM | POA: Diagnosis present

## 2019-05-31 LAB — CBC
HCT: 45.5 % (ref 39.0–52.0)
Hemoglobin: 16.2 g/dL (ref 13.0–17.0)
MCH: 30.7 pg (ref 26.0–34.0)
MCHC: 35.6 g/dL (ref 30.0–36.0)
MCV: 86.2 fL (ref 80.0–100.0)
Platelets: 203 10*3/uL (ref 150–400)
RBC: 5.28 MIL/uL (ref 4.22–5.81)
RDW: 12.7 % (ref 11.5–15.5)
WBC: 8.5 10*3/uL (ref 4.0–10.5)
nRBC: 0 % (ref 0.0–0.2)

## 2019-05-31 LAB — BASIC METABOLIC PANEL
Anion gap: 8 (ref 5–15)
BUN: 42 mg/dL — ABNORMAL HIGH (ref 8–23)
CO2: 24 mmol/L (ref 22–32)
Calcium: 9.7 mg/dL (ref 8.9–10.3)
Chloride: 102 mmol/L (ref 98–111)
Creatinine, Ser: 1.52 mg/dL — ABNORMAL HIGH (ref 0.61–1.24)
GFR calc Af Amer: 54 mL/min — ABNORMAL LOW (ref >60)
GFR calc non Af Amer: 46 mL/min — ABNORMAL LOW (ref >60)
Glucose, Bld: 141 mg/dL — ABNORMAL HIGH (ref 70–99)
Potassium: 5 mmol/L (ref 3.5–5.1)
Sodium: 134 mmol/L — ABNORMAL LOW (ref 135–145)

## 2019-05-31 MED ORDER — LACTATED RINGERS IV BOLUS
2000.0000 mL | Freq: Once | INTRAVENOUS | Status: AC
  Administered 2019-05-31: 2000 mL via INTRAVENOUS

## 2019-05-31 MED ORDER — NAPROXEN 375 MG PO TABS
375.0000 mg | ORAL_TABLET | Freq: Two times a day (BID) | ORAL | 0 refills | Status: AC
Start: 2019-05-31 — End: 2019-06-07

## 2019-05-31 MED ORDER — KETOROLAC TROMETHAMINE 30 MG/ML IJ SOLN
15.0000 mg | Freq: Once | INTRAMUSCULAR | Status: AC
  Administered 2019-05-31: 15 mg via INTRAVENOUS
  Filled 2019-05-31: qty 1

## 2019-05-31 MED ORDER — HYDROCODONE-ACETAMINOPHEN 5-325 MG PO TABS
1.0000 | ORAL_TABLET | Freq: Once | ORAL | Status: AC
  Administered 2019-05-31: 1 via ORAL
  Filled 2019-05-31: qty 1

## 2019-05-31 MED ORDER — HYDROCODONE-ACETAMINOPHEN 5-325 MG PO TABS: 1.0000 | ORAL_TABLET | Freq: Four times a day (QID) | ORAL | 0 refills | Status: AC | PRN

## 2019-05-31 MED ORDER — BENZONATATE 100 MG PO CAPS
100.0000 mg | ORAL_CAPSULE | Freq: Three times a day (TID) | ORAL | 0 refills | Status: AC | PRN
Start: 2019-05-31 — End: 2020-05-30

## 2019-05-31 MED ORDER — SODIUM CHLORIDE 0.9% FLUSH: 3.0000 mL | Freq: Once | INTRAVENOUS | Status: DC

## 2019-05-31 NOTE — Discharge Instructions (Signed)
Use the incentive spirometer at least 6-8 times a day  Take Tylenol as needed for mild pain. Take the prescribed Naproxen twice a day for a week. Do not take IBUPROFEN, ALLEVE, or other NSAID medications while taking this.  Take the Hydrocodone for severe pain. Be aware this has tylenol in it so do not take tylenol AND the hydrocodone at the same time.

## 2019-05-31 NOTE — ED Triage Notes (Signed)
Patient sent to ED for evaluation of hypotension.  Patient states he had GI bug over the weekend, with N/V/D.  Decreased PO intake over weekend.  States that yesterday while walking, he got dizzy, stumbled, and fell -- hitting left ribs on fire hydrant.  Went to [facility] today to get some pain medication for rib pain because ibuprofen is not quite enough.  Also c/o cough.  Patient states his typical activity is running 5k's etc.  States he is feeling weak today, but did not sleep well last night.  Denies dizziness today.  AAOx3.  Skin warm and dry.  MAE equally and strong.  NAD

## 2019-05-31 NOTE — ED Provider Notes (Signed)
[facility] Emergency Department Provider Note  ____________________________________________   First MD Initiated Contact with Patient 05/31/19 1239     (approximate)  I have reviewed the triage vital signs and the nursing notes.   HISTORY  Chief Complaint Hypotension and Weakness    HPI Nelse Hoague is a 69 y.o. male  With PMHx HTN, HLD, gout, here with lightheadedness upon standing and fall with left rib pain. Pt reports that over the weekend, he had a 24-48 hour period of n/v/d, which has now resolved. He had minimal PO intake during the time, and reports he felt like he had a "stomach bug." He started feeling better yesterday and went for a walk. At the end of his walk, he was turning around to go home when he began to feel lightheaded like he was goint ot pass out. He stumbled and landed on his left chest wall against a fire hydrant. Since then, he's had sharp, stabbing, left chest wall pain that is worse w/ movement and palpation. He has had a mild cough with it. No alleviating factors. No other complaints. No hemoptysis. No palpitations. No actual syncope. Denies any head trauma with the fall.        Past Medical History:  Diagnosis Date  . Allergy   . Benign hematuria   . BPH (benign prostatic hypertrophy) with urinary obstruction   . Cancer ([place])    skin  . Gout   . Hyperlipidemia     Patient Active Problem List   Diagnosis Date Noted  . Obesity 06/28/2017  . Advanced care planning/counseling discussion 05/05/2016  . Gout 06/11/2015  . Hyperlipidemia 06/11/2015  . Benign prostatic hyperplasia with urinary obstruction 06/11/2015  . Obstructive sleep apnea 02/02/2008  . Essential hypertension 02/02/2008    Past Surgical History:  Procedure Laterality Date  . FRACTURE SURGERY Right    elbow  . STRABISMUS SURGERY     x3    Prior to Admission medications   Medication Sig Start Date End Date Taking? Authorizing Provider    allopurinol (ZYLOPRIM) 300 MG tablet Take 1 tablet (300 mg total) by mouth daily. 06/28/18   Cannady, Jolene T, NP  benazepril (LOTENSIN) 40 MG tablet TAKE 1 TABLET BY MOUTH EVERY DAY 05/19/19   Cannady, Jolene T, NP  benzonatate (TESSALON PERLES) 100 MG capsule Take 1 capsule (100 mg total) by mouth 3 (three) times daily as needed for cough. 05/31/19 05/30/20  Duffy Bruce, MD  finasteride (PROSCAR) 5 MG tablet Take 1 tablet (5 mg total) by mouth daily. 06/28/18   Cannady, Henrine Screws T, NP  HYDROcodone-acetaminophen (NORCO/VICODIN) 5-325 MG tablet Take 1-2 tablets by mouth every 6 (six) hours as needed for moderate pain or severe pain. 05/31/19 05/30/20  Duffy Bruce, MD  Melatonin 1 MG CAPS Take by mouth as needed.    [provider]  naproxen (NAPROSYN) 375 MG tablet Take 1 tablet (375 mg total) by mouth 2 (two) times daily with a meal for 7 days. 05/31/19 06/07/19  Duffy Bruce, MD  simvastatin (ZOCOR) 40 MG tablet TAKE 1 TABLET BY MOUTH EVERY DAY 02/19/19   Cannady, Henrine Screws T, NP  valACYclovir (VALTREX) 1000 MG tablet Take 1 tablet (1,000 mg total) by mouth 2 (two) times daily as needed. 06/28/18   Venita Lick, NP    Allergies Patient has no known allergies.  Family History  Problem Relation Age of Onset  . Cancer Father        prostate  .  Heart disease Father   . Osteoporosis Mother     Social History Social History   Tobacco Use  . Smoking status: Never Smoker  . Smokeless tobacco: Never Used  Substance Use Topics  . Alcohol use: Yes    Comment: social  . Drug use: No    Review of Systems  Review of Systems  Constitutional: Positive for fatigue. Negative for chills and fever.  HENT: Negative for sore throat.   Respiratory: Positive for cough and chest tightness. Negative for shortness of breath.   Cardiovascular: Positive for chest pain.  Gastrointestinal: Negative for abdominal pain.  Genitourinary: Negative for flank pain.  Musculoskeletal: Negative for  neck pain.  Skin: Negative for rash and wound.  Allergic/Immunologic: Negative for immunocompromised state.  Neurological: Positive for light-headedness. Negative for weakness and numbness.  Hematological: Does not bruise/bleed easily.  All other systems reviewed and are negative.    ____________________________________________  PHYSICAL EXAM:      VITAL SIGNS: ED Triage Vitals  Enc Vitals Group     BP 05/31/19 0954 (!) 97/56     Pulse Rate 05/31/19 0954 64     Resp 05/31/19 0954 18     Temp 05/31/19 0954 97.8 F (36.6 C)     Temp Source 05/31/19 0954 Oral     SpO2 05/31/19 0954 100 %     Weight 05/31/19 0955 215 lb (97.5 kg)     Height 05/31/19 0955 5\' 8"  (1.727 m)     Head Circumference --      Peak Flow --      Pain Score 05/31/19 1016 3     Pain Loc --      Pain Edu? --      Excl. in [place]? --      Physical Exam Vitals and nursing note reviewed.  Constitutional:      General: He is not in acute distress.    Appearance: He is well-developed.  HENT:     Head: Normocephalic and atraumatic.     Mouth/Throat:     Mouth: Mucous membranes are dry.     Comments: Moderately dry MM Eyes:     Conjunctiva/sclera: Conjunctivae normal.  Cardiovascular:     Rate and Rhythm: Normal rate and regular rhythm.     Heart sounds: Normal heart sounds. No murmur. No friction rub.  Pulmonary:     Effort: Pulmonary effort is normal. No respiratory distress.     Breath sounds: Normal breath sounds. No wheezing or rales.  Chest:     Comments: TTP over left lower lateral chest wall. No deformity. No crepitance. No bruising. Abdominal:     General: There is no distension.     Palpations: Abdomen is soft.     Tenderness: There is no abdominal tenderness.  Musculoskeletal:     Cervical back: Neck supple.  Skin:    General: Skin is warm.     Capillary Refill: Capillary refill takes less than 2 seconds.  Neurological:     Mental Status: He is alert and oriented to person, place, and  time.     Motor: No abnormal muscle tone.       ____________________________________________   LABS (all labs ordered are listed, but only abnormal results are displayed)  Labs Reviewed  BASIC METABOLIC PANEL - Abnormal; Notable for the following components:      Result Value   Sodium 134 (*)    Glucose, Bld 141 (*)    BUN 42 (*)  Creatinine, Ser 1.52 (*)    GFR calc non Af Amer 46 (*)    GFR calc Af Amer 54 (*)    All other components within normal limits  CBC    ____________________________________________  EKG: Normal sinus rhythm, VR 65. PR 154, QRS 134, QTc 445. RBBB, no acute abnormalities. No ischemia.  ________________________________________  RADIOLOGY All imaging, including plain films, CT scans, and ultrasounds, independently reviewed by me, and interpretations confirmed via formal radiology reads.  ED MD interpretation:   CXR: Minimal bronchitis changes, no focal abnormality  Official radiology report(s): DG Chest 2 View  Result Date: 05/31/2019 CLINICAL DATA:  Cough and left rib pain. EXAM: CHEST - 2 VIEW COMPARISON:  None. FINDINGS: Normal sized heart. Clear lungs with normal vascularity. Minimal peribronchial thickening. Old, healed left 7th and 8th rib fractures laterally with no acute fractures visualized. IMPRESSION: Minimal bronchitic changes. Electronically Signed   By: Claudie Revering M.D.   On: 05/31/2019 10:52    ____________________________________________  PROCEDURES   Procedure(s) performed (including Critical Care):  Procedures  ____________________________________________  INITIAL IMPRESSION / MDM / [place] / ED COURSE  As part of my medical decision making, I reviewed the following data within the [place] notes reviewed and incorporated, Old chart reviewed, Notes from prior ED visits, and [place] Controlled Substance Database       *Jovon Letter was evaluated in Emergency Department on  05/31/2019 for the symptoms described in the history of present illness. He was evaluated in the context of the global COVID-19 pandemic, which necessitated consideration that the patient might be at risk for infection with the SARS-CoV-2 virus that causes COVID-19. Institutional protocols and algorithms that pertain to the evaluation of patients at risk for COVID-19 are in a state of rapid change based on information released by regulatory bodies including the CDC and federal and state organizations. These policies and algorithms were followed during the patient's care in the ED.  Some ED evaluations and interventions may be delayed as a result of limited staffing during the pandemic.*     Medical Decision Making:  69 yo M here with dizziness upon standing, near syncopal episode, and left rib cage pain. Re: his near syncopal episode - suspect he had some orthostasis in setting of transient viral GI vs food borne illness this weekend. From an abd perspective, his n/v/d is all resolved and he's tolerating PO. Abdomen is soft, NT, ND. Labs do show elevated BUN:Cr ratio c/w dehydration but are otherwise reassuring. IVF given. EKG non-ischemic with no arrhythmia, ectopy, or signs of acute abnormality. From a fall perspective, no head injury. CXR is clear w/ PTX or rib fx. Will treat for rib contusions, give IS and analgesia. Otherwise, pt is HDS, feels better with IVF, and will be discharged home.  ____________________________________________  FINAL CLINICAL IMPRESSION(S) / ED DIAGNOSES  Final diagnoses:  Dehydration  Contusion of rib on left side, initial encounter     MEDICATIONS GIVEN DURING THIS VISIT:  Medications  sodium chloride flush (NS) 0.9 % injection 3 mL (3 mLs Intravenous Not Given 05/31/19 1118)  lactated ringers bolus 2,000 mL (2,000 mLs Intravenous New Bag/Given 05/31/19 1310)  ketorolac (TORADOL) 30 MG/ML injection 15 mg (15 mg Intravenous Given 05/31/19 1311)   HYDROcodone-acetaminophen (NORCO/VICODIN) 5-325 MG per tablet 1 tablet (1 tablet Oral Given 05/31/19 1310)     ED Discharge Orders         Ordered    naproxen (NAPROSYN) 375  MG tablet  2 times daily with meals     05/31/19 1401    HYDROcodone-acetaminophen (NORCO/VICODIN) 5-325 MG tablet  Every 6 hours PRN     05/31/19 1401    benzonatate (TESSALON PERLES) 100 MG capsule  3 times daily PRN     05/31/19 1401           Note:  This document was prepared using Dragon voice recognition software and may include unintentional dictation errors.   Duffy Bruce, MD 05/31/19 [phone]

## 2019-06-09 ENCOUNTER — Other Ambulatory Visit: Payer: Self-pay | Admitting: Nurse Practitioner

## 2019-06-09 NOTE — Telephone Encounter (Signed)
No longer our patient, looks like he's seen through [facility] now

## 2019-06-09 NOTE — Telephone Encounter (Signed)
Requested medication (s) are due for refill today: yes  Requested medication (s) are on the active medication list: yes  Last refill:  05/19/2019  Future visit scheduled: no  Notes to clinic:  looks like patient has a different pcp but was filled by your office last    Requested Prescriptions  Pending Prescriptions Disp Refills   benazepril (LOTENSIN) 40 MG tablet [Pharmacy Med Name: BENAZEPRIL HCL 40 MG TABLET] 60 tablet 0    Sig: TAKE 1 TABLET BY MOUTH EVERY DAY      Cardiovascular:  ACE Inhibitors Failed - 06/09/2019  2:08 AM      Failed - Cr in normal range and within 180 days    Creatinine, Ser  Date Value Ref Range Status  05/31/2019 1.52 (H) 0.61 - 1.24 mg/dL Final          Failed - Valid encounter within last 6 months    Recent Outpatient Visits           11 months ago Encounter for annual physical exam   [place], Henrine Screws T, NP   1 year ago Essential hypertension   [place] [facility], Jeannette How, MD   2 years ago Essential hypertension   [place], Jeannette How, MD   2 years ago Vasovagal syncope   [place], Jeannette How, MD   2 years ago Essential hypertension   [place], Jeannette How, MD              Passed - K in normal range and within 180 days    Potassium  Date Value Ref Range Status  05/31/2019 5.0 3.5 - 5.1 mmol/L Final          Passed - Patient is not pregnant      Passed - Last BP in normal range    BP Readings from Last 1 Encounters:  05/31/19 (!) 98/53

## 2019-07-13 ENCOUNTER — Other Ambulatory Visit: Payer: Self-pay | Admitting: Nurse Practitioner

## 2019-07-20 ENCOUNTER — Other Ambulatory Visit: Payer: Self-pay | Admitting: Nurse Practitioner

## 2019-08-13 ENCOUNTER — Other Ambulatory Visit: Payer: Self-pay | Admitting: Nurse Practitioner

## 2019-08-13 DIAGNOSIS — E785 Hyperlipidemia, unspecified: Secondary | ICD-10-CM

## 2019-08-13 NOTE — Telephone Encounter (Signed)
Requested medication (s) are due for refill today: yes  Requested medication (s) are on the active medication list: yes  Last refill:  02/19/19  Future visit scheduled: no  Notes to clinic:  overdue lab work   Requested Prescriptions  Pending Prescriptions Disp Refills   simvastatin (ZOCOR) 40 MG tablet [Pharmacy Med Name: SIMVASTATIN 40 MG TABLET] 90 tablet 1    Sig: TAKE 1 TABLET BY MOUTH EVERY DAY      Cardiovascular:  Antilipid - Statins Failed - 08/13/2019  9:35 AM      Failed - Total Cholesterol in normal range and within 360 days    Cholesterol, Total  Date Value Ref Range Status  06/28/2018 159 100 - 199 mg/dL Final   Cholesterol Piccolo, Waived  Date Value Ref Range Status  11/30/2017 169 <200 mg/dL Final    Comment:                            Desirable                <200                         Borderline High      200- 239                         High                     >239           Failed - LDL in normal range and within 360 days    LDL Calculated  Date Value Ref Range Status  06/28/2018 79 0 - 99 mg/dL Final          Failed - HDL in normal range and within 360 days    HDL  Date Value Ref Range Status  06/28/2018 44 >39 mg/dL Final          Failed - Triglycerides in normal range and within 360 days    Triglycerides  Date Value Ref Range Status  06/28/2018 182 (H) 0 - 149 mg/dL Final   Triglycerides Piccolo,Waived  Date Value Ref Range Status  11/30/2017 148 <150 mg/dL Final    Comment:                            Normal                   <150                         Borderline High     150 - 199                         High                200 - 499                         Very High                >499           Failed - Valid encounter within last 12 months    Recent Outpatient Visits  1 year ago Encounter for annual physical exam   [place] Venita Lick, NP   1 year ago Essential hypertension   [place], Jeannette How, MD   2 years ago Essential hypertension   [place], Jeannette How, MD   2 years ago Vasovagal syncope   [place], Jeannette How, MD   2 years ago Essential hypertension   [place], Jeannette How, MD              Passed - Patient is not pregnant

## 2019-08-14 NOTE — Telephone Encounter (Signed)
[facility] is no longer Patients primary care please route appropriately.

## 2019-10-26 ENCOUNTER — Encounter: Payer: Self-pay | Admitting: Pulmonary Disease

## 2019-10-26 ENCOUNTER — Ambulatory Visit: Payer: Medicare PPO | Admitting: Pulmonary Disease

## 2019-10-26 ENCOUNTER — Other Ambulatory Visit: Payer: Self-pay

## 2019-10-26 DIAGNOSIS — I10 Essential (primary) hypertension: Secondary | ICD-10-CM

## 2019-10-26 DIAGNOSIS — G4733 Obstructive sleep apnea (adult) (pediatric): Secondary | ICD-10-CM | POA: Diagnosis not present

## 2019-10-26 NOTE — Assessment & Plan Note (Signed)
CPAP is working well on current auto settings.  This was objectively confirmed on download which shows good control of events except occasional nights where AHI goes up to 10/hour, average pressure 16 maximum pressure is 18 on auto 10 to 18 cm There is minimal leak and compliance is excellent more than 8 hours every night  Trial of AirFit F30 full facemask  Weight loss encouraged, compliance with goal of at least 4-6 hrs every night is the expectation. Advised against medications with sedative side effects Cautioned against driving when sleepy - understanding that sleepiness will vary on a day to day basis

## 2019-10-26 NOTE — Patient Instructions (Signed)
CPAP is working well on current auto settings.  Trial of AirFit F30 full facemask

## 2019-10-26 NOTE — Addendum Note (Signed)
Addended by: Lorretta Harp on: 10/26/2019 03:48 PM   Modules accepted: Orders

## 2019-10-26 NOTE — Assessment & Plan Note (Signed)
Well controlled 

## 2019-10-26 NOTE — Progress Notes (Signed)
   Subjective:    Patient ID: Eugene Scott, male    DOB: 06/29/1950, 69 y.o.   MRN: [id number]  HPI  69 yo man for follow-up of OSA  - on CPAP since 2001 and current new machinein2016 -on auto 10-18 cm  He is feeling well rested, cannot sleep without his CPAP machine.  Uses nasal mask and chinstrap.  Denies dryness. No mask issues, pressure is okay Has been getting supplies on time  Vaccinations are up-to-date   Significant tests/ events reviewed  NPSG 2001: AHI 43/hr autotitration to 11cm 2010   Review of Systems Patient denies significant dyspnea,cough, hemoptysis,  chest pain, palpitations, pedal edema, orthopnea, paroxysmal nocturnal dyspnea, lightheadedness, nausea, vomiting, abdominal or  leg pains      Objective:   Physical Exam  Gen. Pleasant, obese, in no distress ENT - no lesions, no post nasal drip Neck: No JVD, no thyromegaly, no carotid bruits Lungs: no use of accessory muscles, no dullness to percussion, decreased without rales or rhonchi  Cardiovascular: Rhythm regular, heart sounds  normal, no murmurs or gallops, no peripheral edema Musculoskeletal: No deformities, no cyanosis or clubbing , no tremors       Assessment & Plan:

## 2019-12-29 ENCOUNTER — Other Ambulatory Visit: Payer: Medicare PPO | Attending: Gastroenterology

## 2020-01-31 ENCOUNTER — Other Ambulatory Visit: Payer: Self-pay

## 2020-01-31 ENCOUNTER — Other Ambulatory Visit
Admission: RE | Admit: 2020-01-31 | Discharge: 2020-01-31 | Disposition: A | Discharge status: Home / Self Care | Payer: Medicare PPO | Source: Ambulatory Visit | Attending: Gastroenterology | Admitting: Gastroenterology

## 2020-01-31 ENCOUNTER — Other Ambulatory Visit: Payer: Medicare PPO

## 2020-01-31 DIAGNOSIS — Z20822 Contact with and (suspected) exposure to covid-19: Secondary | ICD-10-CM | POA: Diagnosis not present

## 2020-01-31 DIAGNOSIS — Z01812 Encounter for preprocedural laboratory examination: Secondary | ICD-10-CM | POA: Insufficient documentation

## 2020-01-31 LAB — SARS CORONAVIRUS 2 (TAT 6-24 HRS): SARS Coronavirus 2: NEGATIVE

## 2020-02-01 ENCOUNTER — Encounter: Payer: Self-pay | Admitting: *Deleted

## 2020-02-02 ENCOUNTER — Ambulatory Visit
Admission: RE | Admit: 2020-02-02 | Discharge: 2020-02-02 | Disposition: A | Discharge status: Home / Self Care | Payer: Medicare PPO | Attending: Gastroenterology | Admitting: Gastroenterology

## 2020-02-02 ENCOUNTER — Ambulatory Visit: Payer: Medicare PPO | Admitting: Certified Registered Nurse Anesthetist

## 2020-02-02 ENCOUNTER — Encounter
Admission: RE | Disposition: A | Discharge status: Home / Self Care | Payer: Self-pay | Source: Home / Self Care | Attending: Gastroenterology

## 2020-02-02 ENCOUNTER — Encounter: Payer: Self-pay | Admitting: *Deleted

## 2020-02-02 ENCOUNTER — Other Ambulatory Visit: Payer: Self-pay

## 2020-02-02 DIAGNOSIS — Z86010 Personal history of colonic polyps: Secondary | ICD-10-CM | POA: Diagnosis not present

## 2020-02-02 DIAGNOSIS — I10 Essential (primary) hypertension: Secondary | ICD-10-CM | POA: Diagnosis not present

## 2020-02-02 DIAGNOSIS — Z79899 Other long term (current) drug therapy: Secondary | ICD-10-CM | POA: Insufficient documentation

## 2020-02-02 DIAGNOSIS — K640 First degree hemorrhoids: Secondary | ICD-10-CM | POA: Insufficient documentation

## 2020-02-02 DIAGNOSIS — K5730 Diverticulosis of large intestine without perforation or abscess without bleeding: Secondary | ICD-10-CM | POA: Insufficient documentation

## 2020-02-02 DIAGNOSIS — Z1211 Encounter for screening for malignant neoplasm of colon: Secondary | ICD-10-CM | POA: Insufficient documentation

## 2020-02-02 DIAGNOSIS — D122 Benign neoplasm of ascending colon: Secondary | ICD-10-CM | POA: Diagnosis not present

## 2020-02-02 DIAGNOSIS — E785 Hyperlipidemia, unspecified: Secondary | ICD-10-CM | POA: Diagnosis not present

## 2020-02-02 HISTORY — PX: COLONOSCOPY WITH PROPOFOL: SHX5780

## 2020-02-02 HISTORY — DX: Sleep apnea, unspecified: G47.30

## 2020-02-02 SURGERY — COLONOSCOPY WITH PROPOFOL
Anesthesia: General

## 2020-02-02 MED ORDER — LIDOCAINE HCL (CARDIAC) PF 100 MG/5ML IV SOSY
PREFILLED_SYRINGE | INTRAVENOUS | Status: DC | PRN
  Administered 2020-02-02: 50 mg via INTRAVENOUS

## 2020-02-02 MED ORDER — PROPOFOL 500 MG/50ML IV EMUL
INTRAVENOUS | Status: DC | PRN
  Administered 2020-02-02: 150 ug/kg/min via INTRAVENOUS

## 2020-02-02 MED ORDER — GLYCOPYRROLATE 0.2 MG/ML IJ SOLN
INTRAMUSCULAR | Status: AC
  Filled 2020-02-02: qty 1

## 2020-02-02 MED ORDER — PHENYLEPHRINE HCL (PRESSORS) 10 MG/ML IV SOLN
INTRAVENOUS | Status: DC | PRN
  Administered 2020-02-02 (×3): 100 ug via INTRAVENOUS

## 2020-02-02 MED ORDER — PROPOFOL 500 MG/50ML IV EMUL
INTRAVENOUS | Status: AC
  Filled 2020-02-02: qty 50

## 2020-02-02 MED ORDER — PROPOFOL 10 MG/ML IV BOLUS
INTRAVENOUS | Status: DC | PRN
  Administered 2020-02-02: 70 mg via INTRAVENOUS

## 2020-02-02 MED ORDER — LIDOCAINE HCL (PF) 2 % IJ SOLN
INTRAMUSCULAR | Status: AC
  Filled 2020-02-02: qty 5

## 2020-02-02 MED ORDER — SODIUM CHLORIDE 0.9 % IV SOLN: INTRAVENOUS | Status: DC

## 2020-02-02 MED ORDER — GLYCOPYRROLATE 0.2 MG/ML IJ SOLN
INTRAMUSCULAR | Status: DC | PRN
  Administered 2020-02-02: .2 mg via INTRAVENOUS

## 2020-02-02 NOTE — Anesthesia Postprocedure Evaluation (Signed)
Anesthesia Post Note  Patient: Eugene Scott  Procedure(s) Performed: COLONOSCOPY WITH PROPOFOL (N/A )  Patient location during evaluation: Endoscopy Anesthesia Type: General Level of consciousness: awake and alert Pain management: pain level controlled Vital Signs Assessment: post-procedure vital signs reviewed and stable Respiratory status: spontaneous breathing, nonlabored ventilation, respiratory function stable and patient connected to nasal cannula oxygen Cardiovascular status: blood pressure returned to baseline and stable Postop Assessment: no apparent nausea or vomiting Anesthetic complications: no   No complications documented.   Last Vitals:  Vitals:   02/02/20 1010 02/02/20 1020  BP: 104/75 104/75  Pulse: 75 68  Resp: 17 19  Temp:    SpO2: 98% 97%    Last Pain:  Vitals:   02/02/20 0950  TempSrc: Temporal  PainSc:                  Martha Clan

## 2020-02-02 NOTE — Transfer of Care (Signed)
Immediate Anesthesia Transfer of Care Note  Patient: Eugene Scott  Procedure(s) Performed: COLONOSCOPY WITH PROPOFOL (N/A )  Patient Location: Endoscopy Unit  Anesthesia Type:General  Level of Consciousness: drowsy  Airway & Oxygen Therapy: Patient Spontanous Breathing and Patient connected to face mask oxygen  Post-op Assessment: Report given to RN and Post -op Vital signs reviewed and stable  Post vital signs: Reviewed and stable  Last Vitals:  Vitals Value Taken Time  BP 92/64 02/02/20 0958  Temp    Pulse 62 02/02/20 0958  Resp 15 02/02/20 0958  SpO2 98 % 02/02/20 0958  Vitals shown include unvalidated device data.  Last Pain:  Vitals:   02/02/20 0950  TempSrc: Temporal  PainSc:          Complications: No complications documented.

## 2020-02-02 NOTE — H&P (Signed)
Outpatient short stay form Pre-procedure 02/02/2020 9:23 AM Raylene Miyamoto MD, MPH  Primary Physician: Dr. Lovie Macadamia  Reason for visit:  Surveillance  History of present illness:   70 y/o gentleman with history of hypertension and HLD here for surveillance colon. No family history of GI malignancies. No blood thinners. No abdominal surgeries.    Current Facility-Administered Medications:  .  0.9 %  sodium chloride infusion, , Intravenous, Continuous, Jolean Madariaga, Hilton Cork, MD, Last Rate: 20 mL/hr at 02/02/20 2458, Continued from Pre-op at 02/02/20 0916  Medications Prior to Admission  Medication Sig Dispense Refill Last Dose  . allopurinol (ZYLOPRIM) 300 MG tablet Take 1 tablet (300 mg total) by mouth daily. 90 tablet 2 02/01/2020 at Unknown time  . benazepril (LOTENSIN) 40 MG tablet TAKE 1 TABLET BY MOUTH EVERY DAY 60 tablet 0 02/02/2020 at 0500  . finasteride (PROSCAR) 5 MG tablet Take 1 tablet (5 mg total) by mouth daily. 90 tablet 2 02/01/2020 at Unknown time  . HYDROcodone-acetaminophen (NORCO/VICODIN) 5-325 MG tablet Take 1-2 tablets by mouth every 6 (six) hours as needed for moderate pain or severe pain. 15 tablet 0 Past Month at Unknown time  . Melatonin 1 MG CAPS Take by mouth as needed.   Past Month at Unknown time  . simvastatin (ZOCOR) 40 MG tablet TAKE 1 TABLET BY MOUTH EVERY DAY 90 tablet 1 02/01/2020 at Unknown time  . valACYclovir (VALTREX) 1000 MG tablet Take 1 tablet (1,000 mg total) by mouth 2 (two) times daily as needed. 60 tablet 6 Past Month at Unknown time  . benzonatate (TESSALON PERLES) 100 MG capsule Take 1 capsule (100 mg total) by mouth 3 (three) times daily as needed for cough. 21 capsule 0      No Known Allergies   Past Medical History:  Diagnosis Date  . Allergy   . Benign hematuria   . BPH (benign prostatic hypertrophy) with urinary obstruction   . Cancer ([place])    skin  . Gout   . Hyperlipidemia   . Hypertension   . Sleep apnea    uses c-pap     Review of systems:  Otherwise negative.    Physical Exam  Gen: Alert, oriented. Appears stated age.  HEENT: PERRLA. Lungs: No respiratory distress CV: RRR Abd: soft, benign, no masses Ext: No edema   Planned procedures: Proceed with colonoscopy. The patient understands the nature of the planned procedure, indications, risks, alternatives and potential complications including but not limited to bleeding, infection, perforation, damage to internal organs and possible oversedation/side effects from anesthesia. The patient agrees and gives consent to proceed.  Please refer to procedure notes for findings, recommendations and patient disposition/instructions.     Raylene Miyamoto MD, MPH Gastroenterology 02/02/2020  9:23 AM

## 2020-02-02 NOTE — Anesthesia Preprocedure Evaluation (Signed)
Anesthesia Evaluation  Patient identified by MRN, date of birth, ID band Patient awake    Reviewed: Allergy & Precautions, H&P , NPO status , Patient's Chart, lab work & pertinent test results, reviewed documented beta blocker date and time   History of Anesthesia Complications Negative for: history of anesthetic complications  Airway Mallampati: II  TM Distance: >3 FB Neck ROM: full    Dental  (+) Dental Advidsory Given, Teeth Intact, Caps   Pulmonary neg shortness of breath, sleep apnea and Continuous Positive Airway Pressure Ventilation , neg COPD, neg recent URI,    Pulmonary exam normal breath sounds clear to auscultation       Cardiovascular Exercise Tolerance: Good hypertension, (-) angina(-) Past MI and (-) Cardiac Stents Normal cardiovascular exam(-) dysrhythmias (-) Valvular Problems/Murmurs Rhythm:regular Rate:Normal     Neuro/Psych negative neurological ROS  negative psych ROS   GI/Hepatic negative GI ROS, Neg liver ROS,   Endo/Other  negative endocrine ROS  Renal/GU negative Renal ROS  negative genitourinary   Musculoskeletal   Abdominal   Peds  Hematology negative hematology ROS (+)   Anesthesia Other Findings Past Medical History: No date: Allergy No date: Benign hematuria No date: BPH (benign prostatic hypertrophy) with urinary obstruction No date: Cancer ([place])     Comment:  skin No date: Gout No date: Hyperlipidemia No date: Hypertension No date: Sleep apnea     Comment:  uses c-pap   Reproductive/Obstetrics negative OB ROS                             Anesthesia Physical Anesthesia Plan  ASA: II  Anesthesia Plan: General   Post-op Pain Management:    Induction: Intravenous  PONV Risk Score and Plan: 2 and TIVA and Propofol infusion  Airway Management Planned: Natural Airway and Nasal Cannula  Additional Equipment:   Intra-op Plan:   Post-operative  Plan:   Informed Consent: I have reviewed the patients History and Physical, chart, labs and discussed the procedure including the risks, benefits and alternatives for the proposed anesthesia with the patient or authorized representative who has indicated his/her understanding and acceptance.     Dental Advisory Given  Plan Discussed with: Anesthesiologist, CRNA and Surgeon  Anesthesia Plan Comments:         Anesthesia Quick Evaluation

## 2020-02-02 NOTE — Op Note (Signed)
[name] Gastroenterology Patient Name: Eugene Scott Procedure Date: 02/02/2020 9:05 AM MRN: [id number] Account #: [id number] Date of Birth: 01/15/1950 Admit Type: Outpatient Age: 70 Room: [facility] ENDO ROOM 3 Gender: Male Note Status: Finalized Procedure:             Colonoscopy Indications:           High risk colon cancer surveillance: Personal history                         of colonic polyps Providers:             Andrey Farmer MD, MD Medicines:             Monitored Anesthesia Care Complications:         No immediate complications. Estimated blood loss:                         Minimal. Procedure:             Pre-Anesthesia Assessment:                        - Prior to the procedure, a History and Physical was                         performed, and patient medications and allergies were                         reviewed. The patient is competent. The risks and                         benefits of the procedure and the sedation options and                         risks were discussed with the patient. All questions                         were answered and informed consent was obtained.                         Patient identification and proposed procedure were                         verified by the physician, the nurse, the anesthetist                         and the technician in the endoscopy suite. Mental                         Status Examination: alert and oriented. Airway                         Examination: normal oropharyngeal airway and neck                         mobility. Respiratory Examination: clear to                         auscultation. CV Examination: normal. Prophylactic  Antibiotics: The patient does not require prophylactic                         antibiotics. Prior Anticoagulants: The patient has                         taken no previous anticoagulant or antiplatelet                         agents. ASA Grade  Assessment: III - A patient with                         severe systemic disease. After reviewing the risks and                         benefits, the patient was deemed in satisfactory                         condition to undergo the procedure. The anesthesia                         plan was to use monitored anesthesia care (MAC).                         Immediately prior to administration of medications,                         the patient was re-assessed for adequacy to receive                         sedatives. The heart rate, respiratory rate, oxygen                         saturations, blood pressure, adequacy of pulmonary                         ventilation, and response to care were monitored                         throughout the procedure. The physical status of the                         patient was re-assessed after the procedure.                        After obtaining informed consent, the colonoscope was                         passed under direct vision. Throughout the procedure,                         the patient's blood pressure, pulse, and oxygen                         saturations were monitored continuously. The                         Colonoscope was introduced through the anus and  advanced to the the cecum, identified by appendiceal                         orifice and ileocecal valve. The colonoscopy was                         somewhat difficult due to significant looping.                         Successful completion of the procedure was aided by                         applying abdominal pressure. The patient tolerated the                         procedure well. The quality of the bowel preparation                         was good. Findings:      The perianal and digital rectal examinations were normal.      A 2 mm polyp was found in the ascending colon. The polyp was sessile.       The polyp was removed with a cold snare. Resection and  retrieval were       complete. Estimated blood loss was minimal.      Non-bleeding internal hemorrhoids were found during retroflexion. The       hemorrhoids were Grade I (internal hemorrhoids that do not prolapse).      A few small-mouthed diverticula were found in the sigmoid colon.      The exam was otherwise without abnormality on direct and retroflexion       views. Impression:            - One 2 mm polyp in the ascending colon, removed with                         a cold snare. Resected and retrieved.                        - Non-bleeding internal hemorrhoids.                        - Diverticulosis in the sigmoid colon.                        - The examination was otherwise normal on direct and                         retroflexion views. Recommendation:        - Discharge patient to home.                        - Resume previous diet.                        - Continue present medications.                        - Await pathology results.                        -  Repeat colonoscopy for surveillance based on                         pathology results.                        - Return to referring physician as previously                         scheduled. Procedure Code(s):     --- Professional ---                        [phone], Colonoscopy, flexible; with removal of                         tumor(s), polyp(s), or other lesion(s) by snare                         technique Diagnosis Code(s):     --- Professional ---                        Z86.010, Personal history of colonic polyps                        K63.5, Polyp of colon                        K64.0, First degree hemorrhoids                        K57.30, Diverticulosis of large intestine without                         perforation or abscess without bleeding CPT copyright 2019 American [employer] Association. All rights reserved. The codes documented in this report are preliminary and upon coder review may  be revised to meet  current compliance requirements. Andrey Farmer MD, MD 02/02/2020 9:54:45 AM Number of Addenda: 0 Note Initiated On: 02/02/2020 9:05 AM Scope Withdrawal Time: 0 hours 12 minutes 40 seconds  Total Procedure Duration: 0 hours 22 minutes 5 seconds  Estimated Blood Loss:  Estimated blood loss was minimal.      [facility]

## 2020-02-02 NOTE — Interval H&P Note (Signed)
History and Physical Interval Note:  02/02/2020 9:24 AM  Eugene Scott  has presented today for surgery, with the diagnosis of Personal History Colon Polyps.  The various methods of treatment have been discussed with the patient and family. After consideration of risks, benefits and other options for treatment, the patient has consented to  Procedure(s): COLONOSCOPY WITH PROPOFOL (N/A) as a surgical intervention.  The patient's history has been reviewed, patient examined, no change in status, stable for surgery.  I have reviewed the patient's chart and labs.  Questions were answered to the patient's satisfaction.     [name]  Ok to proceed with colonoscopy

## 2020-02-05 ENCOUNTER — Encounter: Payer: Self-pay | Admitting: Gastroenterology

## 2020-02-05 LAB — SURGICAL PATHOLOGY

## 2020-02-29 ENCOUNTER — Telehealth: Payer: Self-pay | Admitting: Pulmonary Disease

## 2020-02-29 DIAGNOSIS — G4733 Obstructive sleep apnea (adult) (pediatric): Secondary | ICD-10-CM

## 2020-02-29 NOTE — Telephone Encounter (Signed)
Called and spoke with pt. Pt stated he contacted [employer] for more supplies for his CPAP machine and while he was on the phone with them, they told him that he might be eligible to receive a new cpap machine.  Pt wanted to know when it was that he received his current cpap machine and when I looked at [facility], setup date was 03/06/14 which does make pt eligible to receive a new machine as it has been more than 5 years.  Pt said that his current cpap machine is working well for him but he is just trying to be proactive with trying to get a new machine ordered due to the waitlist with pts trying to receive cpap machines. He wants to try to get this done before something does end up happening with his current machine and then not be able to receive a new one.  Dr. Elsworth Soho, please advise if you are okay with [place] placing an order for pt to receive a new cpap machine from [facility] due to his current one being more than 69 years old and if so, do you want to keep pt's settings the same with min pressure 10 max pressure 18.

## 2020-02-29 NOTE — Telephone Encounter (Signed)
Please let him know that these machines will last for many years. If he still wants a new machine then okay to proceed with writing a prescription for auto CPAP 10 to 18 cm

## 2020-02-29 NOTE — Telephone Encounter (Signed)
Called and spoke with patient to let him know recs of Dr. Elsworth Soho and patient stated that he would like to proceed with order for new CPAP machine. Order has been placed and advised patient that they should reach out to him once they get the order to either let him know they have a machine or if he will need to be placed on waiting list. He expressed understanding. Nothing further needed at this time.

## 2020-10-31 ENCOUNTER — Encounter: Payer: Self-pay | Admitting: Adult Health

## 2020-10-31 ENCOUNTER — Ambulatory Visit: Payer: Medicare PPO | Admitting: Adult Health

## 2020-10-31 ENCOUNTER — Other Ambulatory Visit: Payer: Self-pay

## 2020-10-31 DIAGNOSIS — G4733 Obstructive sleep apnea (adult) (pediatric): Secondary | ICD-10-CM

## 2020-10-31 DIAGNOSIS — Z6833 Body mass index (BMI) 33.0-33.9, adult: Secondary | ICD-10-CM

## 2020-10-31 DIAGNOSIS — E6609 Other obesity due to excess calories: Secondary | ICD-10-CM | POA: Diagnosis not present

## 2020-10-31 NOTE — Patient Instructions (Signed)
Continue on CPAP At bedtime  .  Keep up good work .  Work on healthy weight.  Do not drive if sleepy  Follow up with Dr. Elsworth Soho  In 1 year and As needed

## 2020-10-31 NOTE — Assessment & Plan Note (Signed)
Healthy weight loss discussed 

## 2020-10-31 NOTE — Progress Notes (Signed)
@Patient  ID: Eugene Scott, male    DOB: 1950-08-23, 70 y.o.   MRN: [id number]  Chief Complaint  Patient presents with   Follow-up    Referring provider: Juluis Pitch, MD  HPI: 70 year old male followed for obstructive sleep apnea  TEST/EVENTS :  NPSG 2001:  AHI 43/hr autotitration to 11cm 2010  10/31/2020 Follow up : OSA  Patient presents for a 1 year follow-up.  Patient has underlying severe sleep apnea.  He has maintained on nocturnal CPAP.  Patient says he is doing well on CPAP . Got a new CPAP machine recently , likes it a lot . Working well. He can not go without his CPAP . Wears it each night for 8-9 hr . He travels a lot . [place] that he wears there. Going to [place] next year.  CPAP download shows excellent compliance with daily average usage at 9 hours.  Patient is on auto CPAP 10 to 18 cm H2O.  AHI is 6.5.  Positive mask leaks. Says he is active, doing a 5 K next week.   No Known Allergies  Immunization History  Administered Date(s) Administered   Fluad Quad(high Dose 65+) 10/11/2018   Influenza Whole 10/29/2011   Influenza, High Dose Seasonal PF 10/20/2016, 10/07/2017, 10/13/2019   Influenza,inj,Quad PF,6+ Mos 10/10/2012   Influenza-Unspecified 10/05/2013, 10/13/2014, 10/22/2015   PFIZER Comirnaty(Gray Top)Covid-19 Tri-Sucrose Vaccine 08/05/2020   PFIZER(Purple Top)SARS-COV-2 Vaccination 01/25/2019, 02/15/2019, 10/13/2019   Pneumococcal Conjugate-13 05/05/2016   Pneumococcal Polysaccharide-23 05/11/2017   Tdap 10/25/2012   Zoster Recombinat (Shingrix) 03/15/2017, 05/20/2017   Zoster, Live 04/02/2011    Past Medical History:  Diagnosis Date   Allergy    Benign hematuria    BPH (benign prostatic hypertrophy) with urinary obstruction    Cancer ([facility])    skin   Gout    Hyperlipidemia    Hypertension    Sleep apnea    uses c-pap    Tobacco History: Social History   Tobacco Use  Smoking Status  Never  Smokeless Tobacco Never   Counseling given: Not Answered   Outpatient Medications Prior to Visit  Medication Sig Dispense Refill   benazepril (LOTENSIN) 40 MG tablet TAKE 1 TABLET BY MOUTH EVERY DAY 60 tablet 0   finasteride (PROSCAR) 5 MG tablet Take 1 tablet (5 mg total) by mouth daily. 90 tablet 2   Melatonin 1 MG CAPS Take by mouth as needed.     simvastatin (ZOCOR) 40 MG tablet TAKE 1 TABLET BY MOUTH EVERY DAY 90 tablet 1   valACYclovir (VALTREX) 1000 MG tablet Take 1 tablet (1,000 mg total) by mouth 2 (two) times daily as needed. 60 tablet 6   allopurinol (ZYLOPRIM) 300 MG tablet Take 1 tablet (300 mg total) by mouth daily. (Patient not taking: Reported on 10/31/2020) 90 tablet 2   No facility-administered medications prior to visit.     Review of Systems:   Constitutional:   No  weight loss, night sweats,  Fevers, chills,  +fatigue, or  lassitude.  HEENT:   No headaches,  Difficulty swallowing,  Tooth/dental problems, or  Sore throat,                No sneezing, itching, ear ache, nasal congestion, post nasal drip,   CV:  No chest pain,  Orthopnea, PND, swelling in lower extremities, anasarca, dizziness, palpitations, syncope.   GI  No heartburn, indigestion, abdominal pain, nausea, vomiting, diarrhea, change in bowel habits, loss  of appetite, bloody stools.   Resp:   No chest wall deformity  Skin: no rash or lesions.  GU: no dysuria, change in color of urine, no urgency or frequency.  No flank pain, no hematuria   MS:  No joint pain or swelling.  No decreased range of motion.  No back pain.    Physical Exam  BP 112/68 (BP Location: Left Arm, Patient Position: Sitting, Cuff Size: Normal)   Pulse 63   Temp 97.8 F (36.6 C) (Oral)   Ht 5\' 8"  (1.727 m)   Wt 215 lb 3.2 oz (97.6 kg)   SpO2 99%   BMI 32.72 kg/m   GEN: A/Ox3; pleasant , NAD, well nourished    HEENT:  [place]/AT,  NOSE-clear, THROAT-clear, no lesions, no postnasal drip or exudate noted.    NECK:  Supple w/ fair ROM; no JVD; normal carotid impulses w/o bruits; no thyromegaly or nodules palpated; no lymphadenopathy.    RESP  Clear  P & A; w/o, wheezes/ rales/ or rhonchi. no accessory muscle use, no dullness to percussion  CARD:  RRR, no m/r/g, no peripheral edema, pulses intact, no cyanosis or clubbing.  GI:   Soft & nt; nml bowel sounds; no organomegaly or masses detected.   Musco: Warm bil, no deformities or joint swelling noted.   Neuro: alert, no focal deficits noted.    Skin: Warm, no lesions or rashes    Lab Results:    BMET   BNP No results found for: BNP  ProBNP No results found for: PROBNP  Imaging: No results found.    No flowsheet data found.  No results found for: NITRICOXIDE      Assessment & Plan:   Obstructive sleep apnea Severe obstructive sleep apnea with excellent control and compliance on nocturnal CPAP  Plan  Patient Instructions  Continue on CPAP At bedtime  .  Keep up good work .  Work on healthy weight.  Do not drive if sleepy  Follow up with Dr. Elsworth Soho  In 1 year and As needed        Obesity Healthy weight loss discussed     Rexene Edison, NP 10/31/2020

## 2020-10-31 NOTE — Assessment & Plan Note (Signed)
Severe obstructive sleep apnea with excellent control and compliance on nocturnal CPAP  Plan  Patient Instructions  Continue on CPAP At bedtime  .  Keep up good work .  Work on healthy weight.  Do not drive if sleepy  Follow up with Dr. Elsworth Soho  In 1 year and As needed

## 2021-09-08 IMAGING — CR DG CHEST 2V
1 series · 2 of 2 positions shown · non-contrast
Comparison: None.

CLINICAL DATA: Cough and left rib pain.

EXAM:
CHEST - 2 VIEW

[Series 1: dg chest 2 view · 0.14mm/px · 2 of 2 slices shown]
[im 1/2]
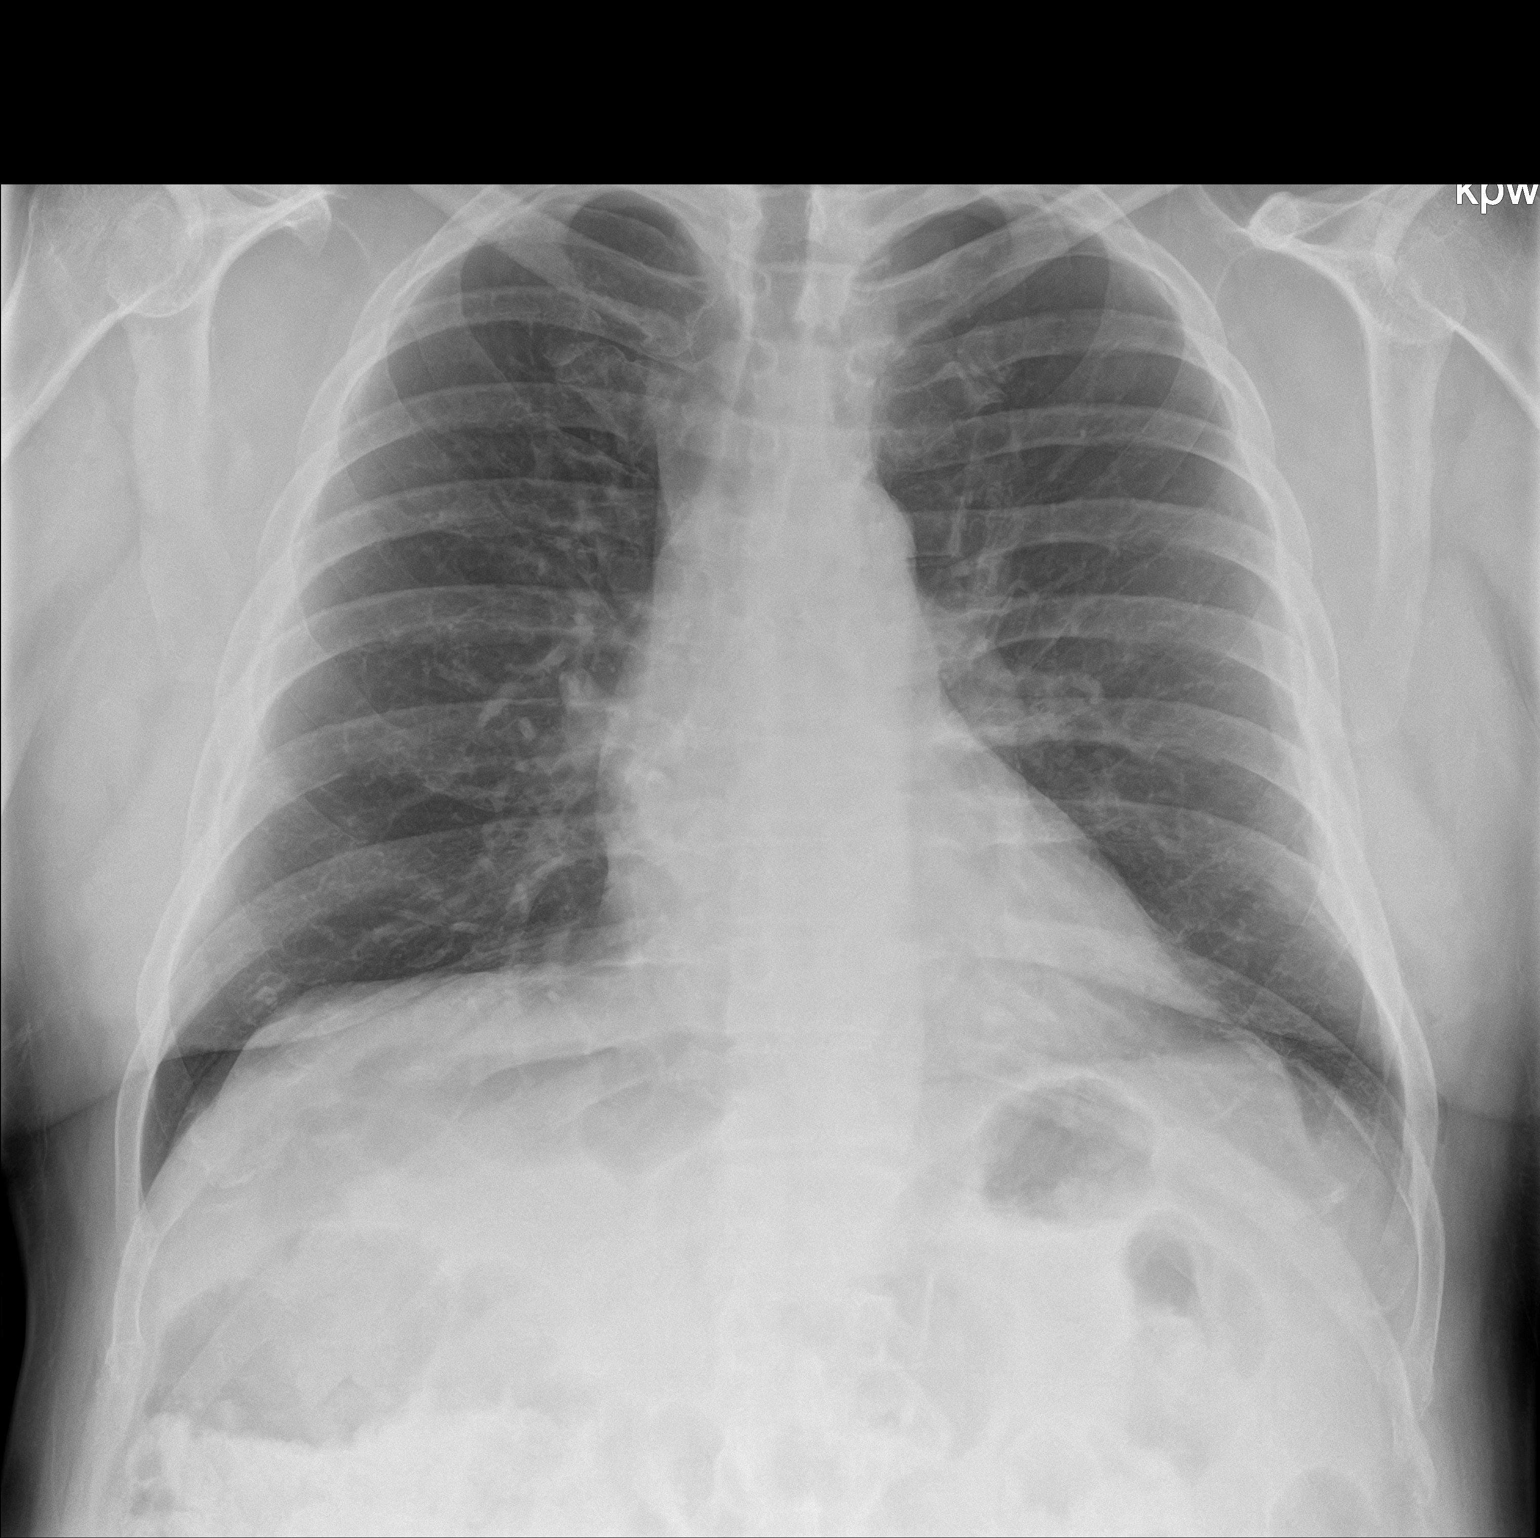
[im 2/2]
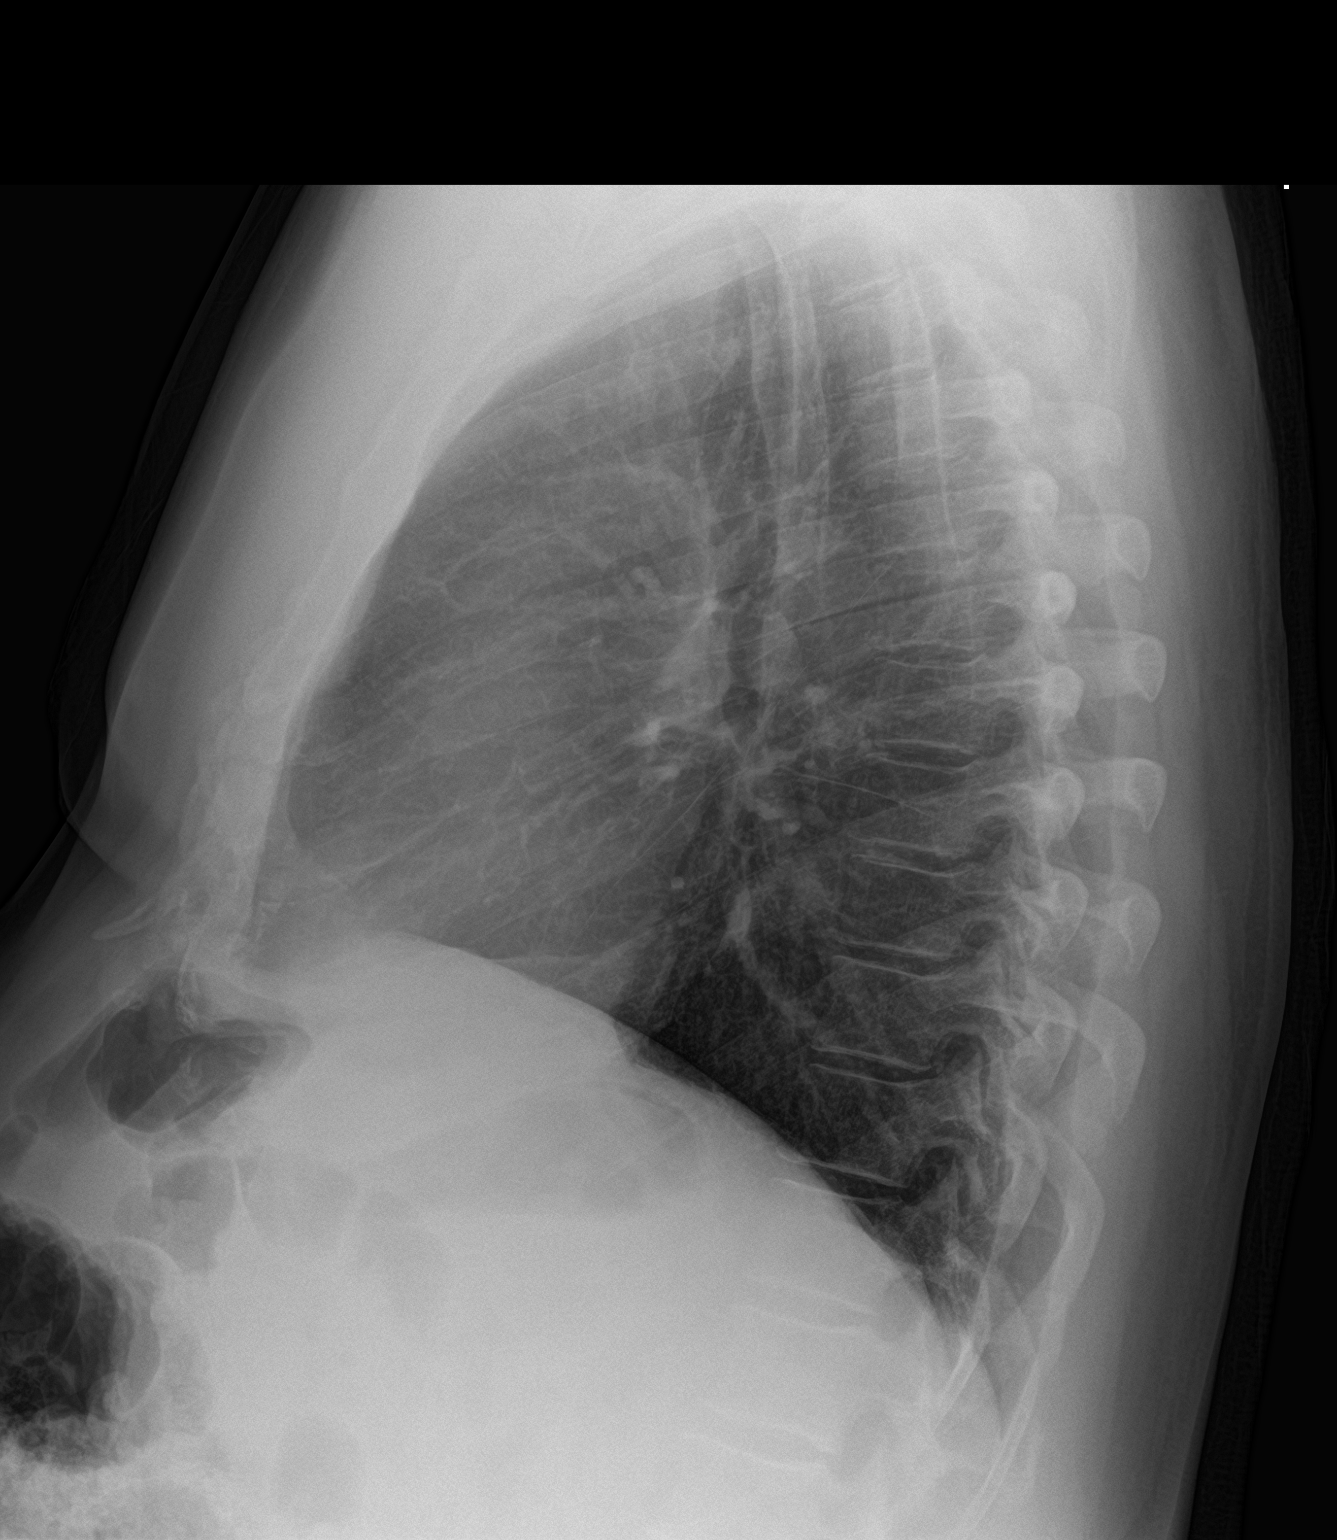

[2 of 2 positions shown; findings below may reference images not displayed]

FINDINGS: Normal sized heart. Clear lungs with normal vascularity. Minimal
peribronchial thickening. Old, healed left 7th and 8th rib fractures
laterally with no acute fractures visualized.
IMPRESSION: Minimal bronchitic changes.

## 2021-10-30 ENCOUNTER — Ambulatory Visit: Payer: Medicare PPO | Admitting: Adult Health

## 2021-10-30 ENCOUNTER — Encounter: Payer: Self-pay | Admitting: Adult Health

## 2021-10-30 VITALS — BP 120/64 | HR 54 | Temp 98.4°F | Ht 68.0 in | Wt 213.2 lb

## 2021-10-30 DIAGNOSIS — E6609 Other obesity due to excess calories: Secondary | ICD-10-CM | POA: Diagnosis not present

## 2021-10-30 DIAGNOSIS — Z6833 Body mass index (BMI) 33.0-33.9, adult: Secondary | ICD-10-CM

## 2021-10-30 DIAGNOSIS — G4733 Obstructive sleep apnea (adult) (pediatric): Secondary | ICD-10-CM

## 2021-10-30 NOTE — Assessment & Plan Note (Signed)
Healthy weight loss 

## 2021-10-30 NOTE — Progress Notes (Signed)
$'@Patient'j$  ID: Eugene Scott, male    DOB: May 17, 1950, 71 y.o.   MRN: [id number]  Chief Complaint  Patient presents with   Follow-up    Referring provider: Juluis Pitch, MD  HPI: 71 year old male followed for obstructive sleep apnea  TEST/EVENTS :  NPSG 2001:  AHI 43/hr autotitration to 11cm 2010  10/30/2021 Follow up : OSA  Patient presents for a 1 year follow-up for sleep apnea.  Patient has underlying severe sleep apnea is on nocturnal CPAP.  Patient says he is doing very well on CPAP.  He says he wears his CPAP machine every night cannot live without it.  Feels that he is benefiting from CPAP with decreased daytime sleepiness.  Patient says he does travel extensively.  Is looking to get a travel mini device to make it easier while he was traveling.  He has had several big trips this year and recently went to [place].  He is planning on going to several places [place] this upcoming [place]. CPAP download shows excellent compliance with daily average usage at 8.5 hours.  Patient is on auto CPAP 10 to 18 cm H2O.  AHI 5.6/hour.  Mild air leaks.  Patient uses a nasal mask. Patient says he is very active.  Is running in 5K's.  Is trying to do his much as he can   No Known Allergies  Immunization History  Administered Date(s) Administered   Fluad Quad(high Dose 65+) 10/11/2018   Influenza Whole 10/29/2011   Influenza, High Dose Seasonal PF 10/20/2016, 10/07/2017, 10/13/2019   Influenza,inj,Quad PF,6+ Mos 10/10/2012   Influenza-Unspecified 10/05/2013, 10/13/2014, 10/22/2015   PFIZER Comirnaty(Gray Top)Covid-19 Tri-Sucrose Vaccine 10/13/2019, 08/05/2020   PFIZER(Purple Top)SARS-COV-2 Vaccination 01/25/2019, 02/15/2019, 10/13/2019   Pfizer Covid-19 Vaccine Bivalent Booster 64yr & up 11/18/2020   Pneumococcal Conjugate-13 05/05/2016   Pneumococcal Polysaccharide-23 05/11/2017   Tdap 10/25/2012   Zoster Recombinat (Shingrix) 03/15/2017, 05/20/2017    Zoster, Live 04/02/2011    Past Medical History:  Diagnosis Date   Allergy    Benign hematuria    BPH (benign prostatic hypertrophy) with urinary obstruction    [place]   Gout    Hyperlipidemia    Hypertension    Sleep apnea    uses c-pap    Tobacco History: Social History   Tobacco Use  Smoking Status Never  Smokeless Tobacco Never   Counseling given: Not Answered   Outpatient Medications Prior to Visit  Medication Sig Dispense Refill   allopurinol (ZYLOPRIM) 100 MG tablet Take 100 mg by mouth daily.     benazepril (LOTENSIN) 40 MG tablet TAKE 1 TABLET BY MOUTH EVERY DAY 60 tablet 0   Melatonin 1 MG CAPS Take by mouth as needed.     simvastatin (ZOCOR) 40 MG tablet TAKE 1 TABLET BY MOUTH EVERY DAY 90 tablet 1   valACYclovir (VALTREX) 1000 MG tablet Take 1 tablet (1,000 mg total) by mouth 2 (two) times daily as needed. 60 tablet 6   allopurinol (ZYLOPRIM) 300 MG tablet Take 1 tablet (300 mg total) by mouth daily. (Patient not taking: Reported on 10/31/2020) 90 tablet 2   finasteride (PROSCAR) 5 MG tablet Take 1 tablet (5 mg total) by mouth daily. 90 tablet 2   No facility-administered medications prior to visit.     Review of Systems:   Constitutional:   No  weight loss, night sweats,  Fevers, chills, fatigue, or  lassitude.  HEENT:   No headaches,  Difficulty swallowing,  Tooth/dental problems, or  Sore throat,                No sneezing, itching, ear ache, nasal congestion, post nasal drip,   CV:  No chest pain,  Orthopnea, PND, swelling in lower extremities, anasarca, dizziness, palpitations, syncope.   GI  No heartburn, indigestion, abdominal pain, nausea, vomiting, diarrhea, change in bowel habits, loss of appetite, bloody stools.   Resp: No shortness of breath with exertion or at rest.  No excess mucus, no productive cough,  No non-productive cough,  No coughing up of blood.  No change in color of mucus.  No wheezing.  No chest wall  deformity  Skin: no rash or lesions.  GU: no dysuria, change in color of urine, no urgency or frequency.  No flank pain, no hematuria   MS:  No joint pain or swelling.  No decreased range of motion.  No back pain.    Physical Exam  BP 120/64 (BP Location: Left Arm, Patient Position: Sitting, Cuff Size: Normal)   Pulse (!) 54   Temp 98.4 F (36.9 C) (Oral)   Ht '5\' 8"'$  (1.727 m)   Wt 213 lb 3.2 oz (96.7 kg)   SpO2 99%   BMI 32.42 kg/m   GEN: A/Ox3; pleasant , NAD, well nourished    HEENT:  Reserve/AT,   NOSE-clear, THROAT-clear, no lesions, no postnasal drip or exudate noted.  Class III MP airway  NECK:  Supple w/ fair ROM; no JVD; normal carotid impulses w/o bruits; no thyromegaly or nodules palpated; no lymphadenopathy.    RESP  Clear  P & A; w/o, wheezes/ rales/ or rhonchi. no accessory muscle use, no dullness to percussion  CARD:  RRR, no m/r/g, no peripheral edema, pulses intact, no cyanosis or clubbing.  GI:   Soft & nt; nml bowel sounds; no organomegaly or masses detected.   Musco: Warm bil, no deformities or joint swelling noted.   Neuro: alert, no focal deficits noted.    Skin: Warm, no lesions or rashes    Lab Results:  CBC    ProBNP No results found for: "PROBNP"  Imaging: No results found.        No data to display          No results found for: "NITRICOXIDE"      Assessment & Plan:   Obstructive sleep apnea Excellent control and compliance on CPAP.  Order for CPAP travel mini.  We will continue same settings.  - discussed how weight can impact sleep and risk for sleep disordered breathing - discussed options to assist with weight loss: combination of diet modification, cardiovascular and strength training exercises   - had an extensive discussion regarding the adverse health consequences related to untreated sleep disordered breathing - specifically discussed the risks for hypertension, coronary artery disease, cardiac dysrhythmias,  cerebrovascular disease, and diabetes - lifestyle modification discussed   - discussed how sleep disruption can increase risk of accidents, particularly when driving - safe driving practices were discussed   Plan  Patient Instructions  Continue on CPAP At bedtime  .  Keep up good work .  Work on healthy weight.  Do not drive if sleepy  Resmed Travel Mini CPAP ([email or web address])  Follow up with Dr. Elsworth Soho  In 1 year and As needed       Obesity Healthy weight loss.      Rexene Edison, NP 10/30/2021

## 2021-10-30 NOTE — Patient Instructions (Addendum)
Continue on CPAP At bedtime  .  Keep up good work .  Work on healthy weight.  Do not drive if sleepy  Resmed Travel Mini CPAP ([email or web address])  Follow up with Dr. Elsworth Soho  In 1 year and As needed

## 2021-10-30 NOTE — Assessment & Plan Note (Signed)
Excellent control and compliance on CPAP.  Order for CPAP travel mini.  We will continue same settings.  - discussed how weight can impact sleep and risk for sleep disordered breathing - discussed options to assist with weight loss: combination of diet modification, cardiovascular and strength training exercises   - had an extensive discussion regarding the adverse health consequences related to untreated sleep disordered breathing - specifically discussed the risks for hypertension, coronary artery disease, cardiac dysrhythmias, cerebrovascular disease, and diabetes - lifestyle modification discussed   - discussed how sleep disruption can increase risk of accidents, particularly when driving - safe driving practices were discussed   Plan  Patient Instructions  Continue on CPAP At bedtime  .  Keep up good work .  Work on healthy weight.  Do not drive if sleepy  Resmed Travel Mini CPAP ([email or web address])  Follow up with Dr. Elsworth Soho  In 1 year and As needed

## 2022-03-11 ENCOUNTER — Telehealth: Payer: Self-pay | Admitting: Adult Health

## 2022-03-11 DIAGNOSIS — G4733 Obstructive sleep apnea (adult) (pediatric): Secondary | ICD-10-CM

## 2022-03-11 NOTE — Telephone Encounter (Signed)
Called and spoke with both pt and spouse. Pt is trying to get a travel cpap mini from [email or web address] and needs an Rx to send in when order the machine online. Rx has been printed out to be signed by TP and then to be placed in the mail for pt. Nothing further needed.

## 2022-03-11 NOTE — Telephone Encounter (Signed)
Pt calling in bc he wants to get a order for Portable Oxygen

## 2022-03-26 ENCOUNTER — Telehealth: Payer: Self-pay | Admitting: Adult Health

## 2022-03-26 NOTE — Telephone Encounter (Signed)
ATC X1 LVM for patient to call the office back. Please ask patient if he wants cpap order mailed to him or he can come pick it up

## 2022-03-26 NOTE — Telephone Encounter (Signed)
Mailing script to patient. Nothing further needed. Closing encounter

## 2022-11-05 ENCOUNTER — Ambulatory Visit (HOSPITAL_BASED_OUTPATIENT_CLINIC_OR_DEPARTMENT_OTHER): Payer: Medicare PPO | Admitting: Pulmonary Disease

## 2022-11-05 ENCOUNTER — Encounter (HOSPITAL_BASED_OUTPATIENT_CLINIC_OR_DEPARTMENT_OTHER): Payer: Self-pay | Admitting: Pulmonary Disease

## 2022-11-05 VITALS — BP 118/68 | HR 85 | Resp 18 | Ht 68.0 in | Wt 204.1 lb

## 2022-11-05 DIAGNOSIS — E6609 Other obesity due to excess calories: Secondary | ICD-10-CM

## 2022-11-05 DIAGNOSIS — E66811 Obesity, class 1: Secondary | ICD-10-CM

## 2022-11-05 DIAGNOSIS — G4733 Obstructive sleep apnea (adult) (pediatric): Secondary | ICD-10-CM | POA: Diagnosis not present

## 2022-11-05 DIAGNOSIS — Z6833 Body mass index (BMI) 33.0-33.9, adult: Secondary | ICD-10-CM | POA: Diagnosis not present

## 2022-11-05 NOTE — Progress Notes (Signed)
Subjective:    Patient ID: Eugene Scott, male    DOB: 30-Aug-1950, 72 y.o.   MRN: [id number]  HPI  72 yo man for follow-up of OSA   - on CPAP since 2001 and current new machine in 2016 -on auto 10-18 cm  Annual follow-up visit. He denies any problems with CPAP mask or pressure.  He is still using a nasal mask with chinstrap, previously we have recommended a fullface mask but he never tried this. He obtained travels many CPAP and this is working well at a fixed pressure he is able to tolerate the humidifier He lost more than 20 pounds to his current weight of 204 with the injectable but has now stopped it and is regaining weight    Significant tests/ events reviewed   NPSG 2001:  AHI 43/hr autotitration to 11cm 2010  Review of Systems neg for any significant sore throat, dysphagia, itching, sneezing, nasal congestion or excess/ purulent secretions, fever, chills, sweats, unintended wt loss, pleuritic or exertional cp, hempoptysis, orthopnea pnd or change in chronic leg swelling. Also denies presyncope, palpitations, heartburn, abdominal pain, nausea, vomiting, diarrhea or change in bowel or urinary habits, dysuria,hematuria, rash, arthralgias, visual complaints, headache, numbness weakness or ataxia.      Objective:   Physical Exam  Gen. Pleasant, obese, in no distress ENT - no lesions, no post nasal drip Neck: No JVD, no thyromegaly, no carotid bruits Lungs: no use of accessory muscles, no dullness to percussion, decreased without rales or rhonchi  Cardiovascular: Rhythm regular, heart sounds  normal, no murmurs or gallops, no peripheral edema Musculoskeletal: No deformities, no cyanosis or clubbing , no tremors       Assessment & Plan:

## 2022-11-05 NOTE — Assessment & Plan Note (Signed)
Weight loss with GLP-1 medication. He is planning to get back on this and discuss with PCP

## 2022-11-05 NOTE — Assessment & Plan Note (Signed)
CPAP download was reviewed which shows excellent control of events on auto settings 10 to 18 cm with average pressure of 15 and maximum pressure of 16.6 cm.  Residual AHI on averages/hour on certain nights does increase to about 10/hour.  He is very compliant more than 8 hours per night.  The few missed nights are not really missed but because he is traveling and using his travel CPAP. CPAP is only helped improve his daytime somnolence and fatigue  Weight loss encouraged, compliance with goal of at least 4-6 hrs every night is the expectation. Advised against medications with sedative side effects Cautioned against driving when sleepy - understanding that sleepiness will vary on a day to day basis

## 2022-11-05 NOTE — Patient Instructions (Signed)
Current CPAP settings are working well.  X Trial of med AirFit F40 fullface mask to DME

## 2024-03-09 ENCOUNTER — Ambulatory Visit (HOSPITAL_BASED_OUTPATIENT_CLINIC_OR_DEPARTMENT_OTHER): Admitting: Pulmonary Disease
